# Patient Record
Sex: Female | Born: 1935 | Race: White | Hispanic: No | Smoking: Never smoker
Health system: Southern US, Community
[De-identification: ages and names within clinical notes are randomized; demographics above are authoritative.]

## PROBLEM LIST (undated history)

## (undated) DIAGNOSIS — N200 Calculus of kidney: Secondary | ICD-10-CM

## (undated) DIAGNOSIS — K589 Irritable bowel syndrome without diarrhea: Secondary | ICD-10-CM

## (undated) DIAGNOSIS — F419 Anxiety disorder, unspecified: Secondary | ICD-10-CM

## (undated) DIAGNOSIS — L500 Allergic urticaria: Secondary | ICD-10-CM

## (undated) DIAGNOSIS — L719 Rosacea, unspecified: Secondary | ICD-10-CM

## (undated) DIAGNOSIS — C50919 Malignant neoplasm of unspecified site of unspecified female breast: Secondary | ICD-10-CM

## (undated) DIAGNOSIS — K219 Gastro-esophageal reflux disease without esophagitis: Secondary | ICD-10-CM

## (undated) DIAGNOSIS — M1990 Unspecified osteoarthritis, unspecified site: Secondary | ICD-10-CM

## (undated) DIAGNOSIS — I517 Cardiomegaly: Secondary | ICD-10-CM

## (undated) DIAGNOSIS — R5383 Other fatigue: Secondary | ICD-10-CM

## (undated) DIAGNOSIS — N390 Urinary tract infection, site not specified: Secondary | ICD-10-CM

## (undated) DIAGNOSIS — M549 Dorsalgia, unspecified: Secondary | ICD-10-CM

## (undated) HISTORY — DX: Allergic urticaria: L50.0

## (undated) HISTORY — DX: Anxiety disorder, unspecified: F41.9

## (undated) HISTORY — DX: Other fatigue: R53.83

## (undated) HISTORY — DX: Calculus of kidney: N20.0

## (undated) HISTORY — PX: HERNIA REPAIR: SHX51

## (undated) HISTORY — PX: HIP SURGERY: SHX245

## (undated) HISTORY — DX: Unspecified osteoarthritis, unspecified site: M19.90

## (undated) HISTORY — DX: Urinary tract infection, site not specified: N39.0

## (undated) HISTORY — DX: Rosacea, unspecified: L71.9

## (undated) HISTORY — DX: Cardiomegaly: I51.7

## (undated) HISTORY — DX: Malignant neoplasm of unspecified site of unspecified female breast: C50.919

## (undated) HISTORY — DX: Irritable bowel syndrome, unspecified: K58.9

## (undated) HISTORY — DX: Dorsalgia, unspecified: M54.9

## (undated) HISTORY — PX: CHOLECYSTECTOMY: SHX55

## (undated) HISTORY — PX: TONSILLECTOMY: SUR1361

## (undated) HISTORY — DX: Gastro-esophageal reflux disease without esophagitis: K21.9

---

## 1984-07-23 HISTORY — PX: MASTECTOMY: SHX3

## 2008-09-02 ENCOUNTER — Ambulatory Visit (HOSPITAL_COMMUNITY): Admission: RE | Admit: 2008-09-02 | Discharge: 2008-09-02 | Payer: Self-pay | Admitting: Orthopedic Surgery

## 2008-11-10 ENCOUNTER — Inpatient Hospital Stay (HOSPITAL_COMMUNITY): Admission: RE | Admit: 2008-11-10 | Discharge: 2008-11-13 | Payer: Self-pay | Admitting: Orthopedic Surgery

## 2010-11-01 LAB — COMPREHENSIVE METABOLIC PANEL
CO2: 31 mEq/L (ref 19–32)
Calcium: 9.6 mg/dL (ref 8.4–10.5)
Creatinine, Ser: 0.5 mg/dL (ref 0.4–1.2)
GFR calc Af Amer: >60 mL/min (ref >60)
GFR calc non Af Amer: >60 mL/min (ref >60)
Glucose, Bld: 109 mg/dL — ABNORMAL HIGH (ref 70–99)
Total Protein: 6.9 g/dL (ref 6.0–8.3)

## 2010-11-01 LAB — URINALYSIS, ROUTINE W REFLEX MICROSCOPIC
Ketones, ur: NEGATIVE mg/dL
Nitrite: NEGATIVE
Protein, ur: NEGATIVE mg/dL
Urobilinogen, UA: 0.2 mg/dL (ref 0.0–1.0)

## 2010-11-01 LAB — CBC
HCT: 23.5 % — ABNORMAL LOW (ref 36.0–46.0)
HCT: 26.6 % — ABNORMAL LOW (ref 36.0–46.0)
HCT: 31.8 % — ABNORMAL LOW (ref 36.0–46.0)
Hemoglobin: 8.2 g/dL — ABNORMAL LOW (ref 12.0–15.0)
Hemoglobin: 12.6 g/dL (ref 12.0–15.0)
MCHC: 34 g/dL (ref 30.0–36.0)
MCHC: 34.8 g/dL (ref 30.0–36.0)
MCHC: 35.1 g/dL (ref 30.0–36.0)
MCHC: 33.9 g/dL (ref 30.0–36.0)
MCV: 91.5 fL (ref 78.0–100.0)
MCV: 91.8 fL (ref 78.0–100.0)
MCV: 92.9 fL (ref 78.0–100.0)
MCV: 93.8 fL (ref 78.0–100.0)
Platelets: 164 10*3/uL (ref 150–400)
Platelets: 169 10*3/uL (ref 150–400)
RBC: 2.9 MIL/uL — ABNORMAL LOW (ref 3.87–5.11)
RBC: 3.95 MIL/uL (ref 3.87–5.11)
RDW: 14.1 % (ref 11.5–15.5)
RDW: 14 % (ref 11.5–15.5)
WBC: 12.3 10*3/uL — ABNORMAL HIGH (ref 4.0–10.5)
WBC: 9.6 10*3/uL (ref 4.0–10.5)

## 2010-11-01 LAB — BASIC METABOLIC PANEL
BUN: 4 mg/dL — ABNORMAL LOW (ref 6–23)
BUN: 4 mg/dL — ABNORMAL LOW (ref 6–23)
CO2: 25 mEq/L (ref 19–32)
CO2: 27 mEq/L (ref 19–32)
CO2: 28 mEq/L (ref 19–32)
Chloride: 102 mEq/L (ref 96–112)
Chloride: 104 mEq/L (ref 96–112)
Chloride: 104 mEq/L (ref 96–112)
Creatinine, Ser: 0.43 mg/dL (ref 0.4–1.2)
Creatinine, Ser: 0.52 mg/dL (ref 0.4–1.2)
GFR calc Af Amer: >60 mL/min (ref >60)
GFR calc non Af Amer: >60 mL/min (ref >60)
Glucose, Bld: 118 mg/dL — ABNORMAL HIGH (ref 70–99)
Glucose, Bld: 121 mg/dL — ABNORMAL HIGH (ref 70–99)
Potassium: 3.5 mEq/L (ref 3.5–5.1)
Potassium: 3.7 mEq/L (ref 3.5–5.1)
Potassium: 4.2 mEq/L (ref 3.5–5.1)
Sodium: 135 mEq/L (ref 135–145)

## 2010-11-01 LAB — PROTIME-INR
Prothrombin Time: 15.3 seconds — ABNORMAL HIGH (ref 11.6–15.2)
Prothrombin Time: 32.9 seconds — ABNORMAL HIGH (ref 11.6–15.2)
Prothrombin Time: 13.1 seconds (ref 11.6–15.2)

## 2010-11-01 LAB — TYPE AND SCREEN

## 2010-11-01 LAB — APTT: aPTT: 28 seconds (ref 24–37)

## 2010-11-01 LAB — ABO/RH: ABO/RH(D): A POS

## 2010-11-01 LAB — PREPARE RBC (CROSSMATCH)

## 2010-12-05 NOTE — Discharge Summary (Signed)
NAMESHELBIA, SCINTO NO.:  [id number]   MEDICAL RECORD NO.:  [id number]          PATIENT TYPE:  INP   LOCATION:  1618                         FACILITY:  [facility]   PHYSICIAN:  Ollen Gross, M.D.    DATE OF BIRTH:  03-24-1934   DATE OF ADMISSION:  11/10/2008  DATE OF DISCHARGE:  11/13/2008                               DISCHARGE SUMMARY   ADMITTING DIAGNOSIS:  Loose right total hip arthroplasty.   DISCHARGE DIAGNOSES:  1. Loose right total hip arthroplasty.  2. Acute postoperative anemia treated with transfusion.   OPERATION:  On November 10, 2008, the patient underwent right total hip  arthroplasty revision with proximal femoral allografting.   BRIEF HISTORY:  This is a 77 year old lady who had total hip replacement  arthroplasty done several years ago at [facility].  She had an  acetabular revision in '84, another revision in '94.  Significant  femoral ostial was seen.  Here recently, she has had increasing pain and  discomfort in her lateral hip with sharp transient pains.  Bone scan  showed marked increase activity at the tip of the stem consistent with  loosening of the femoral stem.  Bone loss was also seen, and after much  discussion including the risks and benefits of surgery, it was decided  to go ahead with the above procedure.  She was cleared preoperatively by  Dr. Norman Herrlich.   COURSE IN THE HOSPITAL:  The patient tolerated the surgical procedure  quite well.  We placed her on Coumadin protocol postoperative for  prevention of DVT.  Meclizine and Dramamine was started for some vertigo  which she had postoperatively in the hospital.  Her hemoglobin dropped  to 8.2.  She was transfused with 2 units of blood, bringing her  hemoglobin up to 10.8 with hematocrit of 31.8.  Very little physical  therapy was done.  She was essentially partial weightbearing 25-50% on  the operative side.  We learned that a bed was available for her to go  to  rehabilitation, and once this was confirmed, by Dr. Shelle Iron discussed  this with the patient and her family, and she agreed to be transferred  to a private room at the nursing facility.  The dressing was clean and  dry.  Neurovascular was intact to the operative extremity.  Felt she  could be safely transferred, and arrangements were made for transfer.   Laboratory values in the hospital hematologically showed a preoperative  hemoglobin of 12.6 with a hematocrit 37.1.  As mentioned above, this  dropped to 8.2 with hematocrit of the 23.5, and her final hemoglobin was  10.8 with hematocrit of 31.8.  Blood chemistries were within normal  limits at discharge.  Sodium was 136 and potassium 3.7.  Urinalysis  showed some oxalate crystals, otherwise within normal limits.  When she  came in, her sodium was 146 and glucose (nonfasting) 109.  No chest x-  ray nor electrocardiogram seen on this chart.   CONDITION ON DISCHARGE:  Improved, stable.   PLAN:  The patient will be transferred to rehabilitation to continue  with her  physical therapy with 25-50% on the operative side  weightbearing.   CURRENT MEDICATIONS:  1. Metronidazole 500 mg every 6 hours for 10 days, the last one taken      April 19.  2. Zofran 4 mg p.r.n. nausea.  3. Robaxin 500 mg p.o. q.6 h. p.r.n. muscle spasm.  4. Oxycodone/APAP 1 to 2 q.4-6 h. p.r.n. pain.  5. Laxative of choice.  6. Dramamine 50 mg tablets q.6 h. p.r.n. dizziness.  7. Cepacol at bedside for sore throat.   Ms. Mitschke is to continue with Coumadin protocol for 4 weeks after the  date of surgery.  Best to keep the INR between 2 and 3.      [name].      Ollen Gross, M.D.  Electronically Signed    DLU/MEDQ  D:  11/13/2008  T:  11/13/2008  Job:  [id number]   cc:   Feliciana Rossetti, MD  Fax: [phone]   Lenice Pressman, M.D.

## 2010-12-05 NOTE — H&P (Signed)
[name], [name] NO.:  [id number]   MEDICAL RECORD NO.:  [id number]          PATIENT TYPE:  INP   LOCATION:  1618                         FACILITY:  [facility]   PHYSICIAN:  Ollen Gross, M.D.    DATE OF BIRTH:  1934-02-29   DATE OF ADMISSION:  11/10/2008  DATE OF DISCHARGE:                              HISTORY & PHYSICAL   DATE OF OFFICE VISIT AND HISTORY AND PHYSICAL:  October 29, 2008.   CHIEF COMPLAINT:  Loose right total hip arthroplasty.   HISTORY OF PRESENT ILLNESS:  Ms. Elizabeth [name] is a 77 year old female who  has been seen in consultation by Dr. Ollen Gross for ongoing problems  with her right hip.  She has had a total hip revision done several years  ago.  Originally her primary surgery was done in [facility] at [facility].  She had an acetabular revision in 1984 at [place], and another  revision in 1994 by Dr. Barrie Lyme at [place].  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  She had a long porous-coated stem done.  For several years she did very  well with no problems.  Unfortunately, recently she has had progressive  intermittent episodes of severe lateral hip pain.  She states any time  that she walks real fast has sharp pains and it has progressively gotten  worse.  She was seen in consultation by Dr. Lequita Halt.  She has undergone  a bone scan.  It showed marked increased activity at the tip of the stem  consistent with loosening of the femoral stem.  The acetabulum looks  fine, but unfortunately she has had loosening of her AML prosthesis.  She has also had a fair amount of bone loss and felt that she would  require revision arthroplasty due to the loss of a lot of the proximal  bone.  It is explained to the patient that she would best be served by  undergoing revision of the femur which can be done in a couple of  different ways.  It is felt that the best option would be a proximal  femoral allograft  utilizing a long stem prosthesis.  Risks and benefits  of this procedure have been discussed with the patient at length and  felt to be stable for surgery.  She has been seen preoperatively by Dr.  Norman Herrlich at the request of Dr. Shary Decamp and Dr. Lequita Halt, and felt that  the patient is stable and could proceed with procedure.   ALLERGIES:  No known drug allergies, intolerances, sensitivity to pain  medications.   CURRENT MEDICATIONS:  Tylenol.   PAST MEDICAL HISTORY:  1. Esophageal reflux.  2. Renal calculi.  3. Rosacea.  4. Cardiomegaly.  5. Hyperlipidemia.  6. Osteoarthritis.  7. Vertigo.  8. Depression.  9. History of gastric polyps.  10.History of urinary tract infections.  11.History of breast cancer.  12.Postmenopausal.  13.Childhood illnesses of measles and mumps.   PAST SURGICAL HISTORY:  1. Appendectomy.  2. Hernia repair.  3. Gallbladder surgery.  4. Tonsillectomy.  5. Left  mastectomy in 1986.  6. Primary total hip replacement in 1976.  7. Acetabular revision at [facility] in 1984.  8. A second revision hip replacement by Dr. Barrie Lyme in [place] in      1994.   FAMILY HISTORY:  Mother with a history of cancer.   SOCIAL HISTORY:  Widowed, retired, nonsmoker.  No alcohol.  Lives alone.  She does want to look into a rehab facility.  She has a 1-story home.   REVIEW OF SYSTEMS:  GENERAL:  No fevers, chills, night sweats.  NEURO:  No seizures, syncope or paralysis.  RESPIRATORY:  No shortness of  breath, productive cough or hemoptysis.  CARDIOVASCULAR:  No chest pain  or orthopnea.  GI:  A little bit of slight constipation.  No nausea,  vomiting, diarrhea.  GU: No dysuria, hematuria or discharge.  MUSCULOSKELETAL: Right hip.   PHYSICAL EXAMINATION:  VITAL SIGNS:  Pulse 74, respirations 12, blood  pressure 142/58.  GENERAL:  A 77 year old white female well-nourished, well-developed, in  no acute distress, alert and cooperative, very pleasant, excellent  historian.   She is accompanied by her friend.  She is somewhat anxious  at the time of exam.  HEENT:  Normocephalic, atraumatic.  Pupils are round and reactive.  Oropharynx clear.  EOMs intact.  NECK:  Supple.  No carotid bruits are appreciated.  CHEST:  Clear to auscultation anterior and posterior chest walls.  No  rhonchi, rales or wheezing.  HEART:  Regular rhythm.  No murmur.  S1 and S2 are noted.  ABDOMEN:  Soft, nontender.  Bowel sounds present.  RECTAL, BREASTS, GENITALIA:  Not done, not pertinent to present illness.  EXTREMITIES:  Right hip:  Right hip flexion is about 100 degrees,  internal rotation 20, external rotation about 30, about 40 degrees of  abduction with a little discomfort, tender over the lateral aspect of  the right hip.   IMPRESSION:  Loose right total hip arthroplasty.   PLAN:  The patient will be admitted to [facility] to undergo a  revision right total hip arthroplasty with a proximal femoral  allografting.  Surgery will be performed by Dr. Ollen Gross.      Alexzandrew L. Perkins, P.A.C.      Ollen Gross, M.D.  Electronically Signed    ALP/MEDQ  D:  11/11/2008  T:  11/12/2008  Job:  [id number]   cc:   Feliciana Rossetti, MD  Fax: [phone]   Norman Herrlich, M.D.  [facility]  [address]  [place], [place] [address]

## 2010-12-05 NOTE — Discharge Summary (Signed)
Elizabeth [name], Elizabeth [name] NO.:  [id number]   MEDICAL RECORD NO.:  [id number]          PATIENT TYPE:  INP   LOCATION:  1618                         FACILITY:  [facility]   PHYSICIAN:  Ollen Gross, M.D.    DATE OF BIRTH:  1934-07-20   DATE OF ADMISSION:  11/10/2008  DATE OF DISCHARGE:  11/13/2008                               DISCHARGE SUMMARY   ADDENDUM:  [name] is to continue with codeine protocol for 4 weeks  after the date of surgery.  Best to keep the INR between 2 and 3.      [name].      Ollen Gross, M.D.     DLU/MEDQ  D:  11/13/2008  T:  11/13/2008  Job:  [id number]   cc:   Ollen Gross, M.D.  Fax: [phone]   Feliciana Rossetti, MD  Fax: [phone]

## 2010-12-05 NOTE — Op Note (Signed)
NAMESHAHRZAD, KOBLE NO.:  [id number]   MEDICAL RECORD NO.:  [id number]          PATIENT TYPE:  INP   LOCATION:  1618                         FACILITY:  [facility]   PHYSICIAN:  Ollen Gross, M.D.    DATE OF BIRTH:  Dec 10, 1933   DATE OF PROCEDURE:  11/10/2008  DATE OF DISCHARGE:                               OPERATIVE REPORT   PREOPERATIVE DIAGNOSIS:  Failed right total hip arthroplasty, aseptic  loosening.   POSTOPERATIVE DIAGNOSIS:  Failed right total hip arthroplasty, aseptic  loosening.   PROCEDURE:  Right total hip arthroplasty revision with proximal femoral  allografting.   SURGEON:  Dr. Lequita Halt.   ASSISTANT:  Avel Peace, PA-C.   ANESTHESIA:  General.   ESTIMATED BLOOD LOSS:  350.   REPLACEMENT:  150 of Cell Saver.   DRAIN:  Hemovac x1.   COMPLICATIONS:  None.   CONDITION:  Stable to recovery.   BRIEF CLINICAL NOTE:  Ms. Ullery is a 77 year old female who has had  multiple problems in regards to her right hip.  She has had revision  surgery as before, most recently done in [place] over 10 years ago.  She has significant thigh pain and bone scan was consistent with  probable loosening of the femoral component.  She has had progressively  worsening pain and dysfunction and presents now for revision hip  arthroplasty.   PROCEDURE IN DETAIL:  After the successful administration of anesthesia,  the patient was placed in the left lateral decubitus position with the  right side up and held with the hip positioner.  The right lower  extremity was isolated from her perineum with plastic drapes and prepped  and draped in the usual sterile fashion.  A long posterolateral incision  was made with a 10 blade through subcutaneous tissue to the level of the  fascia lata which was incised in line with the skin incision.  The  sciatic nerve was palpated and protected and posterior pseudocapsule  excised off of the femur.  We encountered a lot of  osteolytic debris in  the joint.  I removed any small pieces of heterotopic bone present.  I  was able to dislocate the hip and the femoral head was removed.  We then  translocated the femur anteriorly to gain acetabular exposure.  There  was asymmetric polyethylene wear and the acetabular liner was removed.  The acetabular component was well fixed and in good position.  I had a  tonsil and poked through the screw holes that were not filled with  screws and we did not get into any acetabular cysts.  The size of the  cup is a 60 and is a Biomet cup.  We placed a trial 40 mm neutral +5  liner.   The femur was then addressed.  There was a tremendous amount of proximal  bone loss.  The proximal 2-1/2 inches of the stem were all the exposed  with no bone.  The greater trochanter was still intact.  I did a  trochanteric osteotomy to preserve the greater trochanter and used it to  reattach to the allograft.  The stem was fixed and at least with fibrous  tissue.  The bone quality was extremely poor of the remaining bone  proximally.  I was able to easily separate the bone from the prosthesis  and then removed the prosthesis.  Due to the minimal amount of bone left  along the length of the entire prosthesis, I removed the remaining bone  and we did a step-cut osteotomy of the femur.  This was started at the  level of the isthmus.  This was to allow for better fixation of our  allograft.   The right sided proximal femoral allograft was thawed out and I prepared  this on the back table by reaming up to 16.5 mm for placement of a 15 mm  in diameter stem.  I then reamed the allograft proximally up to a 20D  for placement of a 20B sleeve to give [place] a cement mantle for cementing  the sleeve into the allograft.  We prepared for a large to allow for a  small sleeve to go in with the mantle.  We then placed the allograft  into the wound and placed traction on the leg to get an equal length  with the  left leg.  I then marked out the length on the allograft and  did the reversed step-cut on the allograft.  We then with the flexible  reamers reamed distal into her native bone up to 16.5 mm.  I then took  the SROM trial which was a 20 x 15 with a 36 +8 neck extra long length  and passed that through the allograft and then into her native bone.  We  did this with a 40 +0 head and reduced the hip.  There was excellent  soft tissue tension with this construct.  I was able to place a fracture  reduction clamp at the level of the allograft host junction and we  rotated the hip through full motion and with full extension, full  external rotation at 70 degrees of flexion, 40 degrees of abduction, 90  degrees internal rotation, 90 degrees of flexion, 70 degrees of internal  rotation and were stable throughout those parameters.   We then removed the trial and the allograft.  The allograft was soaked  in antibiotic laden solution.  I then removed the trial liner from the  acetabular component and placed the Biomet 40 mm +5 polyethylene liner  into the acetabular shell with a good stable fit.  The permanent 20B  small sleeve was then cemented into the allograft one batch of cement.  All extruded cement was removed.  We then began to impact the 20 x 15  extra long stem with the 36 +8 neck and impacted it through the  allograft.  And then once it was through the allograft, attached it to  the host bone and impacted it all the way down into the host canal.  We  had good stability, but there was some rotational movement between the  host and allograft.  I wanted her to reattach the greater trochanter to  the graft and I was going to use a small plate for that, but decided to  go with the long Zimmer plate in order to also have this bypass the  allograft host junction and obtain fixation there.   We reattached the greater trochanter to the allograft with the long  Zimmer cable plate system.  I impacted  the plate onto the greater  trochanter and then  with two cables proximally reattached the trochanter  to the allograft with the plate.  We also had a third cable proximal to  the allograft host junction and two cables distal to the allograft host  junction.  These were passed through the plate and then around the femur  and back to the plate.  We sequentially tightened all of these until the  plate was down on the bone.  When they were effectively tightened, then  we crimped the locking mechanism and cut the cables.  This had a very  stable reduction.  We again placed through her through a range of motion  and found this to be very stable with stable reduction of the greater  trochanter to the allograft and a stable junction between the graft and  host bone distally.  The wound was then copiously irrigated with saline  solution and the vastus lateralis fascia closed with running #1 Vicryl.  A Hemovac drain was placed and the fascia lata was closed with a running  #1 Vicryl.  Subcu closed with interrupted #1-0 and #2-0 Vicryl and skin  with staples.  The drain was hooked to suction and a bulky sterile  dressing was applied.  She was placed into a knee immobilizer, awakened  and transported to recovery in stable condition.     Ollen Gross, M.D.  Electronically Signed    FA/MEDQ  D:  11/15/2008  T:  11/15/2008  Job:  [id number]

## 2011-12-06 ENCOUNTER — Encounter: Payer: Self-pay | Admitting: Pulmonary Disease

## 2011-12-07 ENCOUNTER — Ambulatory Visit (INDEPENDENT_AMBULATORY_CARE_PROVIDER_SITE_OTHER): Payer: Medicare Other | Admitting: Pulmonary Disease

## 2011-12-07 ENCOUNTER — Encounter: Payer: Self-pay | Admitting: Pulmonary Disease

## 2011-12-07 VITALS — BP 118/76 | HR 79 | Temp 98.4°F | Ht 62.0 in | Wt 110.6 lb

## 2011-12-07 DIAGNOSIS — R059 Cough, unspecified: Secondary | ICD-10-CM

## 2011-12-07 DIAGNOSIS — R05 Cough: Secondary | ICD-10-CM | POA: Insufficient documentation

## 2011-12-07 MED ORDER — BENZONATATE 100 MG PO CAPS: ORAL_CAPSULE | ORAL | Status: AC

## 2011-12-07 NOTE — Progress Notes (Signed)
  Subjective:    Patient ID: Elizabeth [name], female    DOB: 04-03-1934, 78 y.o.   MRN: [id number]  HPI The patient is a 78 year old female who I've been asked to see for chronic cough.  She was in her usual state of health until the third week of April when she began to develop cough with discolored mucus.  She was felt to have acute bronchitis, and was treated with an antibiotic.  Her cough improved almost to baseline, and then began to escalate again.  However, she felt her cough was different than what it had been when things first started.  It was primarily dry and hacking in nature, but she is unable to tell if she has a tickle in her throat or a globus sensation.  She has also been blowing large quantities of purulent mucus from her nose, but this has begun to clear after the antibiotics.  She admits to having throat clearing at times, but does not feel that it is a significant problem.  However, her cough is worse with prolonged conversation and also strong odors.  Patient denies any reflux symptoms at this time, and has no history of asthma.  She has had a recent CT chest that was unremarkable.   Review of Systems  Constitutional: Positive for unexpected weight change. Negative for fever.  HENT: Negative.  Negative for ear pain, nosebleeds, congestion, sore throat, rhinorrhea, sneezing, trouble swallowing, dental problem, postnasal drip and sinus pressure.   Eyes: Negative.  Negative for redness and itching.  Respiratory: Positive for cough. Negative for chest tightness, shortness of breath and wheezing.   Cardiovascular: Negative.  Negative for palpitations and leg swelling.  Gastrointestinal: Negative.  Negative for nausea and vomiting.  Genitourinary: Negative.  Negative for dysuria.  Musculoskeletal: Negative.  Negative for joint swelling.  Skin: Negative.  Negative for rash.  Neurological: Negative.  Negative for headaches.  Hematological: Negative.  Does not bruise/bleed easily.    Psychiatric/Behavioral: Negative.  Negative for dysphoric mood. The patient is not nervous/anxious.        Objective:   Physical Exam Constitutional:  Well developed, no acute distress  HENT:  Nares patent without discharge, but inflammed nasal mucosa noted.  No purulence.   Oropharynx without exudate, palate and uvula are normal  Eyes:  Perrla, eomi, no scleral icterus  Neck:  No JVD, no TMG  Cardiovascular:  Normal rate, regular rhythm, no rubs or gallops.  No murmurs        Intact distal pulses  Pulmonary :  Normal breath sounds, no stridor or respiratory distress   No rales, rhonchi, or wheezing  Abdominal:  Soft, nondistended, bowel sounds present.  No tenderness noted.   Musculoskeletal:  No lower extremity edema noted.  Lymph Nodes:  No cervical lymphadenopathy noted  Skin:  No cyanosis noted  Neurologic:  Alert, appropriate, moves all 4 extremities without obvious deficit.         Assessment & Plan:

## 2011-12-07 NOTE — Patient Instructions (Signed)
Take chlorpheniramine 8mg  at bedtime and at lunch everyday for next 3 weeks. Continue with your sinus rinses No throat clearing, use hard candy (no mint or menthol) during the day to bathe the back of your throat. Minimize voice use as much as possible. Take prilosec otc 20mg  each day before dinner. Use tessalon pearls during day 2 every 6 hrs if needed for cough followup with me in 3 weeks, but call if cough worsens.

## 2011-12-07 NOTE — Assessment & Plan Note (Signed)
The patient's cough is probably upper airway in origin than lower.  I suspect it is being propagated by postnasal drip, possibly a component of reflux, and also cyclical coughing.  I cannot rule out the possibility of a chronic sinusitis, and will need to image the sinuses if her symptoms continue.  For now, I would like to treat her for the above entities, and I have also reviewed the behavioral therapies for cyclical coughing.  I will see her back in 3 weeks to check on progress.

## 2012-01-02 ENCOUNTER — Ambulatory Visit: Payer: Medicare Other | Admitting: Pulmonary Disease

## 2015-09-15 DIAGNOSIS — D692 Other nonthrombocytopenic purpura: Secondary | ICD-10-CM

## 2015-09-15 DIAGNOSIS — R238 Other skin changes: Secondary | ICD-10-CM | POA: Diagnosis not present

## 2018-05-16 ENCOUNTER — Encounter: Payer: Self-pay | Admitting: Gastroenterology

## 2018-05-21 ENCOUNTER — Encounter: Payer: Self-pay | Admitting: Gastroenterology

## 2018-05-21 ENCOUNTER — Ambulatory Visit: Payer: Medicare Other | Admitting: Gastroenterology

## 2018-05-21 VITALS — BP 138/62 | HR 72 | Ht 61.0 in | Wt 111.2 lb

## 2018-05-21 DIAGNOSIS — R195 Other fecal abnormalities: Secondary | ICD-10-CM

## 2018-05-21 NOTE — Patient Instructions (Addendum)
If you are age 82 or older, your body mass index should be between 23-30. Your Body mass index is 21.02 kg/m. If this is out of the aforementioned range listed, please consider follow up with your Primary Care Provider.  If you are age 61 or younger, your body mass index should be between 19-25. Your Body mass index is 21.02 kg/m. If this is out of the aformentioned range listed, please consider follow up with your Primary Care Provider.   You have been scheduled for a colonoscopy. Please follow written instructions given to you at your visit today.  Please pick up your prep supplies at the pharmacy within the next 1-3 days. If you use inhalers (even only as needed), please bring them with you on the day of your procedure. Your physician has requested that you go to www.startemmi.com and enter the access code given to you at your visit today. This web site gives a general overview about your procedure. However, you should still follow specific instructions given to you by our office regarding your preparation for the procedure.  Please purchase the following medications over the counter and take as directed: Lactaid 2 per meal  Thank you,  Dr. Jackquline Denmark

## 2018-05-21 NOTE — Progress Notes (Signed)
Chief Complaint: heme positive stools.  Referring Provider:  Raina Mina., MD      ASSESSMENT AND PLAN;   #1. Heme positive stools. Nl CBC, neg CT abdo/pelvis at [facility] (report awaited)  #2. Lactose intolerance.  Plan -Lactaid 2/day with each lactose containing diet. -Proceed with colonoscopy.  I have discussed the risks and benefits.  The risks including risk of perforation requiring laparotomy, bleeding after polypectomy requiring blood transfusions and risks of anesthesia/sedation were discussed.  Rare risks of missing colorectal neoplasms were also discussed.  Alternatives were given.  Patient is fully aware and agrees to proceed. All the questions were answered. Colonoscopy will be scheduled in upcoming days.  Patient is to report immediately if there is any significant weight loss or excessive bleeding until then. Consent forms were given for review.   HPI:    Elizabeth [name] is a 85 y.o. female  With heme positive stools on routine physical examination No Rectal bleeding No nausea, vomiting, heartburn, regurgitation, odynophagia or dysphagia.  No significant diarrhea or constipation.  There is no melena or hematochezia. No unintentional weight loss.  Had Nl CBC with Hb 13.3 02/2018, neg CT.  Patient has been considerable stress since she moved twice in 2 months.  No Abdominal pain.  Never had colonoscopy.   Past Medical History:  Diagnosis Date  . Abdominal pain   . Allergic rhinitis   . Allergic urticaria   . Anxiety   . Arthritis   . Backache   . Breast cancer ([place])   . Cardiomegaly   . Esophageal reflux   . Fatigue   . IBS (irritable bowel syndrome)   . Nephrolithiasis   . Osteoarthritis   . Rosacea   . UTI (urinary tract infection)     Past Surgical History:  Procedure Laterality Date  . CHOLECYSTECTOMY    . HERNIA REPAIR    . HIP SURGERY     x4  . MASTECTOMY Left 1986  . TONSILLECTOMY      Family History  Problem Relation Age of Onset  .  Lung cancer Mother   . Liver cancer Mother   . Kidney cancer Mother   . Colon cancer Neg Hx   . Esophageal cancer Neg Hx     Social History   Tobacco Use  . Smoking status: Never Smoker  . Smokeless tobacco: Never Used  Substance Use Topics  . Alcohol use: Never    Frequency: Never  . Drug use: Never    Current Outpatient Medications  Medication Sig Dispense Refill  . acetaminophen (TYLENOL) 500 MG tablet Take 500 mg by mouth every 6 (six) hours as needed.    . carboxymethylcellulose (REFRESH PLUS) 0.5 % SOLN 1 drop as needed.     No current facility-administered medications for this visit.     Allergies  Allergen Reactions  . Clotrimazole-Betamethasone   . Levofloxacin   . Penicillins     Review of Systems:  Constitutional: Denies fever, chills, diaphoresis, appetite change and fatigue.  HEENT: Denies photophobia, eye pain, redness, hearing loss, ear pain, congestion, sore throat, rhinorrhea, sneezing, mouth sores, neck pain, neck stiffness and tinnitus.   Respiratory: Denies SOB, DOE, cough, chest tightness,  and wheezing.   Cardiovascular: Denies chest pain, palpitations and leg swelling.  Genitourinary: Denies dysuria, urgency, frequency, hematuria, flank pain and difficulty urinating.  Musculoskeletal: Denies myalgias, back pain, joint swelling, arthralgias and gait problem.  Skin: No rash.  Neurological: Denies dizziness, seizures, syncope, weakness, light-headedness,  numbness and headaches.  Hematological: Denies adenopathy. Easy bruising, personal or family bleeding history  Psychiatric/Behavioral: Has anxiety or depression.     Physical Exam:    BP 138/62   Pulse 72   Ht 5\' 1"  (1.549 m)   Wt 111 lb 4 oz (50.5 kg)   BMI 21.02 kg/m  Filed Weights   05/21/18 1059  Weight: 111 lb 4 oz (50.5 kg)   Constitutional:  Well-developed, in no acute distress. Psychiatric: Normal mood and affect. Behavior is normal. HEENT: Pupils normal.  Conjunctivae are  normal. No scleral icterus. Neck supple.  Cardiovascular: Normal rate, regular rhythm. No edema Pulmonary/chest: Effort normal and breath sounds normal. No wheezing, rales or rhonchi. Abdominal: Soft, nondistended. Nontender. Bowel sounds active throughout. There are no masses palpable. No hepatomegaly. Rectal:  defered Neurological: Alert and oriented to person place and time. Skin: Skin is warm and dry. No rashes noted.  Data Reviewed:  Hemoglobin 13.3 03/20/2018 Hemoglobin 13.6 08/30/2017 Nl CMP    Carmell Austria, MD 05/21/2018, 11:16 AM  Cc: Raina Mina., MD

## 2018-05-22 ENCOUNTER — Telehealth: Payer: Self-pay | Admitting: Gastroenterology

## 2018-05-22 NOTE — Telephone Encounter (Signed)
Lets repeat Hemoccult cards x 3 in 2 weeks.

## 2018-05-22 NOTE — Telephone Encounter (Signed)
Patient cx procedure for this Tues 11/5 states that she was given the option to get proc or do another stool sample she wants to do another stool sample instead of colonoscopy.

## 2018-05-22 NOTE — Telephone Encounter (Signed)
Larena Glassman would you take care of this for Dr Lyndel Safe?  Thank you

## 2018-05-22 NOTE — Telephone Encounter (Signed)
Please advise. I did not see this in the assessment and plan.

## 2018-05-23 NOTE — Telephone Encounter (Signed)
Patient will come by office and pick up Hemmocult cards

## 2018-05-27 ENCOUNTER — Encounter: Payer: Medicare Other | Admitting: Gastroenterology

## 2019-01-02 ENCOUNTER — Encounter: Payer: Self-pay | Admitting: Gastroenterology

## 2019-01-02 ENCOUNTER — Telehealth (INDEPENDENT_AMBULATORY_CARE_PROVIDER_SITE_OTHER): Payer: Medicare Other | Admitting: Gastroenterology

## 2019-01-02 ENCOUNTER — Other Ambulatory Visit: Payer: Self-pay

## 2019-01-02 VITALS — Ht 62.0 in | Wt 104.0 lb

## 2019-01-02 DIAGNOSIS — R195 Other fecal abnormalities: Secondary | ICD-10-CM | POA: Diagnosis not present

## 2019-01-02 DIAGNOSIS — R1013 Epigastric pain: Secondary | ICD-10-CM

## 2019-01-02 DIAGNOSIS — E611 Iron deficiency: Secondary | ICD-10-CM

## 2019-01-02 MED ORDER — SUPREP BOWEL PREP KIT 17.5-3.13-1.6 GM/177ML PO SOLN: 1.0000 | ORAL | 0 refills | Status: DC

## 2019-01-02 MED ORDER — OMEPRAZOLE 20 MG PO CPDR: 20.0000 mg | DELAYED_RELEASE_CAPSULE | Freq: Every day | ORAL | 0 refills | Status: DC

## 2019-01-02 NOTE — Progress Notes (Signed)
Chief Complaint: heme positive stools.  Referring Provider:  Raina Mina., MD      ASSESSMENT AND PLAN;   #1. Heme positive stools. Nl CBC, neg CT abdo/pelvis at [facility] (report awaited)  #2. Epi pain/discomfort with ? melena.  I believe dark stools was because of Kaopectate.  #3.  Iron deficiency without anemia.  Plan -Omeprazole 20mg  po qd x 2 weeks. -Continue probiotics 1 po qd -Proceed with EGD/colonoscopy with MiraLAX in 2 to 3 weeks.  I have discussed the risks and benefits.  The risks including risk of perforation requiring laparotomy, bleeding after polypectomy requiring blood transfusions and risks of anesthesia/sedation were discussed.  Rare risks of missing colorectal neoplasms were also discussed.  Alternatives were given.  Patient is fully aware and agrees to proceed. All the questions were answered. -Records from [facility] regarding recent CT. Larena Glassman to obtain.   HPI:    Elizabeth [name] is a 85 y.o. female  With heme positive stools on routine physical examination Last week had diarrhea x 4 days. Took kaopectetate Was eating strawberries, bread Got better but then had dark stools, then had clear BMs No Rectal bleeding No nausea, vomiting, heartburn, regurgitation, odynophagia or dysphagia.  No significant diarrhea or constipation.  There is no melena or hematochezia. No unintentional weight loss.  Had Nl CBC with Hb 13.3 02/2018, neg CT. most recent labs from 12/29/2018 showed hemoglobin to be 12.8, MCV 92.  She has mild iron deficiency with saturation of 17%, iron 40.  Normal BUN/creatinine.  Patient has been considerable stress since she moved twice in 2 months.  No Abdominal pain. No bloating.  Has lost some weight  Never had colonoscopy.  She was scheduled previously for colonoscopy but did not come.  Has fatigue   Past Medical History:  Diagnosis Date  . Abdominal pain   . Allergic rhinitis   . Allergic urticaria   . Anxiety   . Arthritis    . Backache   . Breast cancer ([place])   . Cardiomegaly   . Esophageal reflux   . Fatigue   . IBS (irritable bowel syndrome)   . Nephrolithiasis   . Osteoarthritis   . Rosacea   . UTI (urinary tract infection)     Past Surgical History:  Procedure Laterality Date  . CHOLECYSTECTOMY    . HERNIA REPAIR    . HIP SURGERY     x4  . MASTECTOMY Left 1986  . TONSILLECTOMY      Family History  Problem Relation Age of Onset  . Lung cancer Mother   . Liver cancer Mother   . Kidney cancer Mother   . Colon cancer Neg Hx   . Esophageal cancer Neg Hx     Social History   Tobacco Use  . Smoking status: Never Smoker  . Smokeless tobacco: Never Used  Substance Use Topics  . Alcohol use: Never    Frequency: Never  . Drug use: Never    Current Outpatient Medications  Medication Sig Dispense Refill  . acetaminophen (TYLENOL) 500 MG tablet Take 500 mg by mouth every 6 (six) hours as needed.    . Probiotic Product (PROBIOTIC DAILY PO) Take by mouth.     No current facility-administered medications for this visit.     Allergies  Allergen Reactions  . Clotrimazole-Betamethasone   . Levofloxacin   . Penicillins     Review of Systems:  neg     Physical Exam:    Ht  5\' 2"  (1.575 m)   Wt 104 lb (47.2 kg)   BMI 19.02 kg/m  Filed Weights   01/02/19 0941  Weight: 104 lb (47.2 kg)   Constitutional:  Well-developed, in no acute distress. Psychiatric: Normal mood and affect. Behavior is normal. HEENT: Pupils normal.  Conjunctivae are normal. No scleral icterus. Neck supple.  Cardiovascular: Normal rate, regular rhythm. No edema Pulmonary/chest: Effort normal and breath sounds normal. No wheezing, rales or rhonchi. Abdominal: Soft, nondistended. Nontender. Bowel sounds active throughout. There are no masses palpable. No hepatomegaly. Rectal:  defered Neurological: Alert and oriented to person place and time. Skin: Skin is warm and dry. No rashes noted.  Data Reviewed:   Hemoglobin 13.3 03/20/2018 Hemoglobin 13.6 08/30/2017 Hb  Nl CMP   Telephonic Visit in Setting of [place]  Patient has given consent for a telephonic visit today and understands that is the same as is required for any face-to-face patient encounter except that the service was provided via telephone.   At the time of this telephonic visit the patient was located at home and I, the Provider, at the office of [facility], [place].   Patient was referred to [facility] - established pt  No one other than patient and I participated in this telephonic visit today.   Total time of telephonic visit/coordination of care: 25 minutes.    Carmell Austria MD           Carmell Austria, MD 01/02/2019, 10:27 AM  Cc: Raina Mina., MD

## 2019-01-02 NOTE — Patient Instructions (Addendum)
If you are age 83 or older, your body mass index should be between 23-30. Your Body mass index is 19.02 kg/m. If this is out of the aforementioned range listed, please consider follow up with your Primary Care Provider.  If you are age 85 or younger, your body mass index should be between 19-25. Your Body mass index is 19.02 kg/m. If this is out of the aformentioned range listed, please consider follow up with your Primary Care Provider.   We have sent the following medications to your pharmacy for you to pick up at your convenience Suprep Omeprazole 20 mg   Please purchase the following medications over the counter and take as directed: Probiotics  You have been scheduled for an endoscopy and colonoscopy. Please follow the written instructions given to you at your visit today. Please pick up your prep supplies at the pharmacy within the next 1-3 days. If you use inhalers (even only as needed), please bring them with you on the day of your procedure. Your physician has requested that you go to www.startemmi.com and enter the access code given to you at your visit today. This web site gives a general overview about your procedure. However, you should still follow specific instructions given to you by our office regarding your preparation for the procedure.  To help prevent the possible spread of infection to our patients, communities, and staff; we will be implementing the following measures:  As of now we are not allowing any visitors/family members to accompany you to any upcoming appointments with [facility]. If you have any concerns about this please contact our office to discuss prior to the appointment.   Thank you,  Dr. Jackquline Denmark

## 2019-01-28 ENCOUNTER — Encounter: Payer: Self-pay | Admitting: Gastroenterology

## 2019-02-06 ENCOUNTER — Telehealth: Payer: Self-pay | Admitting: Gastroenterology

## 2019-02-06 NOTE — Telephone Encounter (Signed)

## 2019-02-06 NOTE — Telephone Encounter (Signed)
Pt responded "no" to all screening questions °

## 2019-02-09 ENCOUNTER — Ambulatory Visit (AMBULATORY_SURGERY_CENTER): Payer: Medicare Other | Admitting: Gastroenterology

## 2019-02-09 ENCOUNTER — Encounter: Payer: Self-pay | Admitting: Gastroenterology

## 2019-02-09 ENCOUNTER — Other Ambulatory Visit: Payer: Self-pay

## 2019-02-09 VITALS — BP 116/53 | HR 59 | Temp 98.3°F | Resp 11 | Ht 62.0 in | Wt 104.0 lb

## 2019-02-09 DIAGNOSIS — K2970 Gastritis, unspecified, without bleeding: Secondary | ICD-10-CM | POA: Diagnosis not present

## 2019-02-09 DIAGNOSIS — R195 Other fecal abnormalities: Secondary | ICD-10-CM

## 2019-02-09 DIAGNOSIS — D127 Benign neoplasm of rectosigmoid junction: Secondary | ICD-10-CM | POA: Diagnosis not present

## 2019-02-09 DIAGNOSIS — K317 Polyp of stomach and duodenum: Secondary | ICD-10-CM

## 2019-02-09 DIAGNOSIS — K5730 Diverticulosis of large intestine without perforation or abscess without bleeding: Secondary | ICD-10-CM | POA: Diagnosis not present

## 2019-02-09 DIAGNOSIS — R1013 Epigastric pain: Secondary | ICD-10-CM

## 2019-02-09 DIAGNOSIS — D122 Benign neoplasm of ascending colon: Secondary | ICD-10-CM | POA: Diagnosis not present

## 2019-02-09 MED ORDER — SODIUM CHLORIDE 0.9 % IV SOLN: 500.0000 mL | Freq: Once | INTRAVENOUS | Status: AC

## 2019-02-09 NOTE — Progress Notes (Signed)
Gauze dressingPaper tape was applied to IV site after IV was removed.  I was very carefull with pt's skin at IV site and taking off electrods.  Pt is a fall risk, so I assisted pt with dressing.  No complaints noted in the recovery room. maw

## 2019-02-09 NOTE — Progress Notes (Signed)
Temperature taken by Fayrene Fearing, CMA, VS taken by Rica Mote, CMA

## 2019-02-09 NOTE — Progress Notes (Signed)
Called to room to assist during endoscopic procedure.  Patient ID and intended procedure confirmed with present staff. Received instructions for my participation in the procedure from the performing physician.  

## 2019-02-09 NOTE — Progress Notes (Signed)
PT taken to PACU. Monitors in place. VSS. Report given to RN. 

## 2019-02-09 NOTE — Op Note (Signed)
[facility] Patient Name: Elizabeth [name] Procedure Date: 02/09/2019 10:16 AM MRN: [id number] Endoscopist: Jackquline Denmark , MD Age: 85 Referring MD:  Date of Birth: [birth year] Gender: Female Account #: [id number] Procedure:                Upper GI endoscopy Indications:              Epigastric abdominal pain, iron deficiency anemia Medicines:                Monitored Anesthesia Care Procedure:                Pre-Anesthesia Assessment:                           - Prior to the procedure, a History and Physical                            was performed, and patient medications and                            allergies were reviewed. The patient's tolerance of                            previous anesthesia was also reviewed. The risks                            and benefits of the procedure and the sedation                            options and risks were discussed with the patient.                            All questions were answered, and informed consent                            was obtained. Prior Anticoagulants: The patient has                            taken no previous anticoagulant or antiplatelet                            agents. ASA Grade Assessment: II - A patient with                            mild systemic disease. After reviewing the risks                            and benefits, the patient was deemed in                            satisfactory condition to undergo the procedure.                           After obtaining informed consent, the endoscope was  passed under direct vision. Throughout the                            procedure, the patient's blood pressure, pulse, and                            oxygen saturations were monitored continuously. The                            Model GIF-HQ190 [id number]) scope was introduced                            through the mouth, and advanced to the second part                            of  duodenum. The upper GI endoscopy was                            accomplished without difficulty. The patient                            tolerated the procedure well. Scope In: Scope Out: Findings:                 The examined esophagus was normal.                           The Z-line was regular and was found 35 cm from the                            incisors.                           Localized mild inflammation characterized by                            erythema was found in the gastric antrum. Biopsies                            were taken with a cold forceps for histology.                            Estimated blood loss: none.                           4-5, 2 to 4 mm sessile polyps with no bleeding and                            no stigmata of recent bleeding were found in the                            gastric body. 2 of the polyps were removed using a  cold biopsy forceps                           The examined duodenum was normal. Biopsies for                            histology were taken with a cold forceps for                            evaluation of celiac disease. Estimated blood loss:                            none.                           A few 2 to 4 mm sessile polyps with no bleeding and                            no stigmata of recent bleeding were found in the                            gastric body. The polyp was removed with a cold                            biopsy forceps. Resection and retrieval were                            complete. Estimated blood loss: none. Complications:            No immediate complications. Estimated Blood Loss:     Estimated blood loss: none. Impression:               -Mild gastritis.                           -Incidental small gastric polyps (s/p polypectomy x                            2). Recommendation:           - Patient has a contact number available for                            emergencies. The  signs and symptoms of potential                            delayed complications were discussed with the                            patient. Return to normal activities tomorrow.                            Written discharge instructions were provided to the                            patient.                           -  Resume previous diet.                           - Continue present medications.                           - Await pathology results.                           - Return to GI clinic in 12 weeks. Jackquline Denmark, MD 02/09/2019 11:03:07 AM This report has been signed electronically.

## 2019-02-09 NOTE — Patient Instructions (Signed)
YOU HAD AN ENDOSCOPIC PROCEDURE TODAY AT THE [facility]:   Refer to the procedure report that was given to you for any specific questions about what was found during the examination.  If the procedure report does not answer your questions, please call your gastroenterologist to clarify.  If you requested that your care partner not be given the details of your procedure findings, then the procedure report has been included in a sealed envelope for you to review at your convenience later.  YOU SHOULD EXPECT: Some feelings of bloating in the abdomen. Passage of more gas than usual.  Walking can help get rid of the air that was put into your GI tract during the procedure and reduce the bloating. If you had a lower endoscopy (such as a colonoscopy or flexible sigmoidoscopy) you may notice spotting of blood in your stool or on the toilet paper. If you underwent a bowel prep for your procedure, you may not have a normal bowel movement for a few days.  Please Note:  You might notice some irritation and congestion in your nose or some drainage.  This is from the oxygen used during your procedure.  There is no need for concern and it should clear up in a day or so.  SYMPTOMS TO REPORT IMMEDIATELY:   Following lower endoscopy (colonoscopy or flexible sigmoidoscopy):  Excessive amounts of blood in the stool  Significant tenderness or worsening of abdominal pains  Swelling of the abdomen that is new, acute  Fever of 100F or higher   Following upper endoscopy (EGD)  Vomiting of blood or coffee ground material  New chest pain or pain under the shoulder blades  Painful or persistently difficult swallowing  New shortness of breath  Fever of 100F or higher  Black, tarry-looking stools  For urgent or emergent issues, a gastroenterologist can be reached at any hour by calling [phone].   DIET:  We do recommend a small meal at first, but then you may proceed to your regular diet.  Drink  plenty of fluids but you should avoid alcoholic beverages for 24 hours.  ACTIVITY:  You should plan to take it easy for the rest of today and you should NOT DRIVE or use heavy machinery until tomorrow (because of the sedation medicines used during the test).    FOLLOW UP: Our staff will call the number listed on your records 48-72 hours following your procedure to check on you and address any questions or concerns that you may have regarding the information given to you following your procedure. If we do not reach you, we will leave a message.  We will attempt to reach you two times.  During this call, we will ask if you have developed any symptoms of COVID 19. If you develop any symptoms (ie: fever, flu-like symptoms, shortness of breath, cough etc.) before then, please call [phone].  If you test positive for Covid 19 in the 2 weeks post procedure, please call and report this information to us.    If any biopsies were taken you will be contacted by phone or by letter within the next 1-3 weeks.  Please call us at [phone] if you have not heard about the biopsies in 3 weeks.    SIGNATURES/CONFIDENTIALITY: You and/or your care partner have signed paperwork which will be entered into your electronic medical record.  These signatures attest to the fact that that the information above on your After Visit Summary has been reviewed and is   understood.  Full responsibility of the confidentiality of this discharge information lies with you and/or your care-partner.    Handouts were given to you on polyps, diverticulosis, and gastritis. NO ASPIRIN, ASPIRIN CONTAINING PRODUCTS (BC OR GOODY POWDERS) OR NSAIDS (IBUPROFEN, ADVIL, ALEVE, AND MOTRIN) FOR 5 days; TYLENOL IS OK TO TAKE You may resume your other current medications today. Await biopsy results. Return to GI clinic in 12 weeks.  The office will call you with this appointment. Please call if any questions or concerns.

## 2019-02-09 NOTE — Op Note (Signed)
[facility] Patient Name: Elizabeth [name] Procedure Date: 02/09/2019 10:15 AM MRN: [id number] Endoscopist: Jackquline Denmark , MD Age: 85 Referring MD:  Date of Birth: 09/19/1933 Gender: Female Account #: [id number] Procedure:                Colonoscopy Indications:              Iron deficiency with heme positive stools. Medicines:                Monitored Anesthesia Care Procedure:                Pre-Anesthesia Assessment:                           - Prior to the procedure, a History and Physical                            was performed, and patient medications and                            allergies were reviewed. The patient's tolerance of                            previous anesthesia was also reviewed. The risks                            and benefits of the procedure and the sedation                            options and risks were discussed with the patient.                            All questions were answered, and informed consent                            was obtained. Prior Anticoagulants: The patient has                            taken no previous anticoagulant or antiplatelet                            agents. ASA Grade Assessment: II - A patient with                            mild systemic disease. After reviewing the risks                            and benefits, the patient was deemed in                            satisfactory condition to undergo the procedure.                           After obtaining informed consent, the colonoscope  was passed under direct vision. Throughout the                            procedure, the patient's blood pressure, pulse, and                            oxygen saturations were monitored continuously. The                            Colonoscope was introduced through the anus and                            advanced to the 2 cm into the ileum. The                            colonoscopy was technically  difficult and complex                            due to significant looping. Successful completion                            of the procedure was aided by applying abdominal                            pressure. The patient tolerated the procedure well.                            The quality of the bowel preparation was good                            except in some areas of the colon where there was                            adherent stool. Aggressive suctioning and                            aspiration was performed. The colon was highly                            redundant and somewhat difficult to examine. The                            terminal ileum, ileocecal valve, appendiceal                            orifice, and rectum were photographed. Scope In: 10:32:11 AM Scope Out: 10:56:49 AM Scope Withdrawal Time: 0 hours 10 minutes 57 seconds  Total Procedure Duration: 0 hours 24 minutes 38 seconds  Findings:                 Two sessile polyps were found in the proximal                            ascending colon. The polyps  were 4 to 6 mm in size.                            These polyps were removed with a cold snare.                            Resection and retrieval were complete. Estimated                            blood loss: none.                           A 12 mm polyp was found in the recto-sigmoid colon,                            15 cm from the anal verge. The polyp was                            semi-pedunculated. The polyp was removed with a hot                            snare. Resection and retrieval were complete.                            Estimated blood loss: none.                           A few small-mouthed diverticula were found in the                            sigmoid colon.                           The terminal ileum appeared normal.                           The exam was otherwise without abnormality. The                            colon was highly  redundant. Complications:            No immediate complications. Estimated Blood Loss:     Estimated blood loss: none. Impression:               -Colonic polyps s/p polypectomy.                           -Mild sigmoid diverticulosis.                           -Otherwise grossly normal colonoscopy to TI. The                            colon was highly redundant. Recommendation:           - Patient has a contact number available for  emergencies. The signs and symptoms of potential                            delayed complications were discussed with the                            patient. Return to normal activities tomorrow.                            Written discharge instructions were provided to the                            patient.                           - Resume previous diet.                           - Continue present medications.                           - No aspirin, ibuprofen, naproxen, or other                            non-steroidal anti-inflammatory drugs for 5 days                            after polyp removal.                           - Await pathology results.                           - Repeat colonoscopy for surveillance based on                            pathology results.                           - Return to GI clinic in 12 weeks. Jackquline Denmark, MD 02/09/2019 11:09:23 AM This report has been signed electronically.

## 2019-02-11 ENCOUNTER — Telehealth: Payer: Self-pay | Admitting: *Deleted

## 2019-02-11 NOTE — Telephone Encounter (Signed)
  Follow up Call-  Call back number 02/09/2019  Post procedure Call Back phone  # [phone]  Permission to leave phone message Yes  Some recent data might be hidden     Patient questions:  Do you have a fever, pain , or abdominal swelling? No. Pain Score  0 *  Have you tolerated food without any problems? Yes.    Have you been able to return to your normal activities? Yes.    Do you have any questions about your discharge instructions: Diet   No. Medications  No. Follow up visit  No.  Do you have questions or concerns about your Care? Yes.  questions regarding diverticulosis. Pt did not receive a handout will mail her one and one as well on High fiber.  Actions: * If pain score is 4 or above: No action needed, pain <4.  1. Have you developed a fever since your procedure? no  2.   Have you had an respiratory symptoms (SOB or cough) since your procedure? no  3.   Have you tested positive for COVID 19 since your procedure no  4.   Have you had any family members/close contacts diagnosed with the COVID 19 since your procedure?  no   If yes to any of these questions please route to Joylene John, RN and Alphonsa Gin, Therapist, sports.

## 2019-02-14 ENCOUNTER — Encounter: Payer: Self-pay | Admitting: Gastroenterology

## 2019-03-12 ENCOUNTER — Telehealth: Payer: Self-pay | Admitting: Gastroenterology

## 2019-03-12 DIAGNOSIS — R1013 Epigastric pain: Secondary | ICD-10-CM

## 2019-03-12 NOTE — Telephone Encounter (Signed)
Pt reported that she has burning pains post EGD.  Please advise.

## 2019-03-12 NOTE — Telephone Encounter (Signed)
Patient returned call to the office- patient has been to [facility] (urgent care)- been having burning pain in abdomen that radiate thru her abd to her back-patient was prescribed Bentyl (Q4hrs)-no relief, advised patient to talk with a dietician, advised to eat bland meals-meal changes have changed her bowel habits to having 3 bowel movements since 03/03/2019-has not tried any OTC-patient reports she feels like her stomach is "raw inside"-patient reports she is having harder than usual- RN advised patient to use stool softener-patient is requesting to have information from Dr. Lyndel Safe instead of [facility];  Patient feels like she is having a diverticulosis flare up- started having symptoms on 03/03/2019 after eating fried fish on 02/27/2019=please advise as patient is taking the Bentyl only when stomach pains are really bad-but will need a refill if she needs to continue on this medication; Bentyl is causing eye issues; patient admits to being under "a lot a lot of stress";   Please advise

## 2019-03-12 NOTE — Telephone Encounter (Signed)
Plan: -Take Bentyl 10 mg twice daily-half an hour before meals and bedtime.  Please call in 60, 2 refills. -Check CBC, CMP and lipase if not already done in urgent care.  If these are done in urgent care, please get the results. -CT Abdo/pelvis with p.o. and IV contrast prior to appointment with me. -Drink plenty of water.  Thx  RG

## 2019-03-12 NOTE — Telephone Encounter (Signed)
Left message for patient to call back to the office;  

## 2019-03-13 MED ORDER — DICYCLOMINE HCL 10 MG PO CAPS: 10.0000 mg | ORAL_CAPSULE | Freq: Two times a day (BID) | ORAL | 2 refills | Status: AC

## 2019-03-13 NOTE — Telephone Encounter (Signed)
Pt returning your call

## 2019-03-13 NOTE — Telephone Encounter (Signed)
CT abd/pel is scheduled for 11:00 am on 03/17/2019; patient will need to pick up contrast from [place] office when completing lab work needed for CT to be done;

## 2019-03-13 NOTE — Telephone Encounter (Signed)
Instructions for CT and contrast will be in lab at [facility] for patient pick up on Monday AM; patient is aware to pick these items up when getting lab work done on 03/16/2019; patient advised to call back to the office should questions/concerns arise;

## 2019-03-13 NOTE — Telephone Encounter (Signed)
Left message for patient to call back; Lab work was not done at [facility] in [place] or at PCP; patients appt for 03/16/2019 at 8:30 am has been rescheduled for 03/26/2019 at 9:30 am to ensure adequate time for lab work and CT scan to be ordered and completed for review prior to patient's appt; lab work and CT orders have been placed; CT abd/pel scan will be scheduled when patient returns call to the office;

## 2019-03-13 NOTE — Telephone Encounter (Signed)
Left message for patient to return call to the office; RX for Bentyl has been sent to patient's pharmacy of choice;

## 2019-03-16 ENCOUNTER — Other Ambulatory Visit (INDEPENDENT_AMBULATORY_CARE_PROVIDER_SITE_OTHER): Payer: Medicare Other

## 2019-03-16 ENCOUNTER — Ambulatory Visit: Payer: Medicare Other | Admitting: Gastroenterology

## 2019-03-16 DIAGNOSIS — R1013 Epigastric pain: Secondary | ICD-10-CM

## 2019-03-16 LAB — COMPREHENSIVE METABOLIC PANEL
ALT: 10 U/L (ref 0–35)
AST: 14 U/L (ref 0–37)
Albumin: 4.2 g/dL (ref 3.5–5.2)
Alkaline Phosphatase: 82 U/L (ref 39–117)
BUN: 11 mg/dL (ref 6–23)
CO2: 28 mEq/L (ref 19–32)
Calcium: 9.6 mg/dL (ref 8.4–10.5)
Chloride: 100 mEq/L (ref 96–112)
Creatinine, Ser: 0.51 mg/dL (ref 0.40–1.20)
GFR: 115.15 mL/min (ref >60.00)
Glucose, Bld: 100 mg/dL — ABNORMAL HIGH (ref 70–99)
Potassium: 4.7 mEq/L (ref 3.5–5.1)
Sodium: 135 mEq/L (ref 135–145)
Total Bilirubin: 0.8 mg/dL (ref 0.2–1.2)
Total Protein: 6.9 g/dL (ref 6.0–8.3)

## 2019-03-16 LAB — CBC WITH DIFFERENTIAL/PLATELET
Basophils Absolute: 0 10*3/uL (ref 0.0–0.1)
Basophils Relative: 0.5 % (ref 0.0–3.0)
Eosinophils Absolute: 0.1 10*3/uL (ref 0.0–0.7)
Eosinophils Relative: 2.5 % (ref 0.0–5.0)
HCT: 38 % (ref 36.0–46.0)
Hemoglobin: 13.2 g/dL (ref 12.0–15.0)
Lymphocytes Relative: 34.5 % (ref 12.0–46.0)
Lymphs Abs: 1.6 10*3/uL (ref 0.7–4.0)
MCHC: 34.7 g/dL (ref 30.0–36.0)
MCV: 89.9 fl (ref 78.0–100.0)
Monocytes Absolute: 0.4 10*3/uL (ref 0.1–1.0)
Monocytes Relative: 8.1 % (ref 3.0–12.0)
Neutro Abs: 2.6 10*3/uL (ref 1.4–7.7)
Neutrophils Relative %: 54.4 % (ref 43.0–77.0)
Platelets: 266 10*3/uL (ref 150.0–400.0)
RBC: 4.23 Mil/uL (ref 3.87–5.11)
RDW: 13.3 % (ref 11.5–15.5)
WBC: 4.8 10*3/uL (ref 4.0–10.5)

## 2019-03-16 LAB — LIPASE: Lipase: 11 U/L (ref 11.0–59.0)

## 2019-03-17 ENCOUNTER — Other Ambulatory Visit: Payer: Self-pay

## 2019-03-17 ENCOUNTER — Encounter (HOSPITAL_BASED_OUTPATIENT_CLINIC_OR_DEPARTMENT_OTHER): Payer: Self-pay

## 2019-03-17 ENCOUNTER — Ambulatory Visit (HOSPITAL_BASED_OUTPATIENT_CLINIC_OR_DEPARTMENT_OTHER)
Admission: RE | Admit: 2019-03-17 | Discharge: 2019-03-17 | Disposition: A | Discharge status: Home / Self Care | Payer: Medicare Other | Source: Ambulatory Visit | Attending: Gastroenterology | Admitting: Gastroenterology

## 2019-03-17 DIAGNOSIS — R1013 Epigastric pain: Secondary | ICD-10-CM | POA: Diagnosis present

## 2019-03-17 MED ORDER — IOHEXOL 300 MG/ML  SOLN
100.0000 mL | Freq: Once | INTRAMUSCULAR | Status: AC | PRN
  Administered 2019-03-17: 100 mL via INTRAVENOUS

## 2019-03-19 ENCOUNTER — Telehealth: Payer: Self-pay | Admitting: Gastroenterology

## 2019-03-19 NOTE — Telephone Encounter (Signed)
Pt has questions regarding "what was discussed yesterday."

## 2019-03-19 NOTE — Telephone Encounter (Signed)
Called and spoke with patient-patient is wanting clarification on Miralax dosing-information provided to patient; patient advised to call back to the office should questions/concerns arise;

## 2019-03-26 ENCOUNTER — Ambulatory Visit: Payer: Medicare Other | Admitting: Gastroenterology

## 2019-03-26 ENCOUNTER — Encounter: Payer: Self-pay | Admitting: Gastroenterology

## 2019-03-26 ENCOUNTER — Other Ambulatory Visit: Payer: Self-pay

## 2019-03-26 VITALS — BP 126/64 | HR 66 | Temp 98.2°F | Ht 61.0 in | Wt 104.2 lb

## 2019-03-26 DIAGNOSIS — K581 Irritable bowel syndrome with constipation: Secondary | ICD-10-CM

## 2019-03-26 DIAGNOSIS — F418 Other specified anxiety disorders: Secondary | ICD-10-CM | POA: Diagnosis not present

## 2019-03-26 DIAGNOSIS — R1013 Epigastric pain: Secondary | ICD-10-CM | POA: Diagnosis not present

## 2019-03-26 DIAGNOSIS — G8929 Other chronic pain: Secondary | ICD-10-CM

## 2019-03-26 DIAGNOSIS — K5790 Diverticulosis of intestine, part unspecified, without perforation or abscess without bleeding: Secondary | ICD-10-CM

## 2019-03-26 MED ORDER — OMEPRAZOLE 20 MG PO CPDR: 20.0000 mg | DELAYED_RELEASE_CAPSULE | Freq: Every day | ORAL | 3 refills | Status: AC | PRN

## 2019-03-26 MED ORDER — DICYCLOMINE HCL 10 MG PO CAPS: 10.0000 mg | ORAL_CAPSULE | Freq: Four times a day (QID) | ORAL | 3 refills | Status: AC | PRN
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
## 2019-03-26 NOTE — Patient Instructions (Addendum)
If you are age 83 or older, your body mass index should be between 23-30. Your Body mass index is 19.7 kg/m. If this is out of the aforementioned range listed, please consider follow up with your Primary Care Provider.  If you are age 23 or younger, your body mass index should be between 19-25. Your Body mass index is 19.7 kg/m. If this is out of the aformentioned range listed, please consider follow up with your Primary Care Provider.   To help prevent the possible spread of infection to our patients, communities, and staff; we will be implementing the following measures:  As of now we are not allowing any visitors/family members to accompany you to any upcoming appointments with [facility]. If you have any concerns about this please contact our office to discuss prior to the appointment.   We have sent the following medications to your pharmacy for you to pick up at your convenience: Omperazole 20mg  daily as needed. Bentyl 10mg  4 times daily as needed.  Begin a Lactose Free Diet. Eat cereal in the morning and warm water. Take a daily probiotic.  Please purchase the following medications over the counter and take as directed: Miralax 17g by mouth mixed with 8oz of liquid daily.  Thank you,  Dr. Jackquline Denmark

## 2020-04-26 ENCOUNTER — Encounter (HOSPITAL_COMMUNITY): Payer: Self-pay

## 2020-04-26 ENCOUNTER — Observation Stay (HOSPITAL_COMMUNITY)
Admission: EM | Admit: 2020-04-26 | Discharge: 2020-04-27 | Disposition: A | Discharge status: Home / Self Care | Payer: Medicare PPO | Attending: Family Medicine | Admitting: Family Medicine

## 2020-04-26 ENCOUNTER — Emergency Department (HOSPITAL_COMMUNITY): Payer: Medicare PPO

## 2020-04-26 ENCOUNTER — Other Ambulatory Visit: Payer: Self-pay

## 2020-04-26 DIAGNOSIS — Z20822 Contact with and (suspected) exposure to covid-19: Secondary | ICD-10-CM | POA: Insufficient documentation

## 2020-04-26 DIAGNOSIS — K219 Gastro-esophageal reflux disease without esophagitis: Secondary | ICD-10-CM | POA: Insufficient documentation

## 2020-04-26 DIAGNOSIS — F419 Anxiety disorder, unspecified: Secondary | ICD-10-CM | POA: Diagnosis present

## 2020-04-26 DIAGNOSIS — R1031 Right lower quadrant pain: Secondary | ICD-10-CM | POA: Insufficient documentation

## 2020-04-26 DIAGNOSIS — K589 Irritable bowel syndrome without diarrhea: Secondary | ICD-10-CM | POA: Diagnosis present

## 2020-04-26 DIAGNOSIS — R55 Syncope and collapse: Principal | ICD-10-CM | POA: Diagnosis present

## 2020-04-26 LAB — CBC WITH DIFFERENTIAL/PLATELET
Abs Immature Granulocytes: 0.05 10*3/uL (ref 0.00–0.07)
Basophils Absolute: 0 10*3/uL (ref 0.0–0.1)
Basophils Relative: 0 %
Eosinophils Absolute: 0 10*3/uL (ref 0.0–0.5)
Eosinophils Relative: 0 %
HCT: 37.5 % (ref 36.0–46.0)
Hemoglobin: 12.7 g/dL (ref 12.0–15.0)
Immature Granulocytes: 0 %
Lymphocytes Relative: 5 %
Lymphs Abs: 0.7 10*3/uL (ref 0.7–4.0)
MCH: 30.5 pg (ref 26.0–34.0)
MCHC: 33.9 g/dL (ref 30.0–36.0)
MCV: 90.1 fL (ref 80.0–100.0)
Monocytes Absolute: 0.7 10*3/uL (ref 0.1–1.0)
Monocytes Relative: 5 %
Neutro Abs: 12.1 10*3/uL — ABNORMAL HIGH (ref 1.7–7.7)
Neutrophils Relative %: 90 %
Platelets: 233 10*3/uL (ref 150–400)
RBC: 4.16 MIL/uL (ref 3.87–5.11)
RDW: 13.4 % (ref 11.5–15.5)
WBC: 13.6 10*3/uL — ABNORMAL HIGH (ref 4.0–10.5)
nRBC: 0 % (ref 0.0–0.2)

## 2020-04-26 LAB — TYPE AND SCREEN
ABO/RH(D): A POS
Antibody Screen: NEGATIVE

## 2020-04-26 LAB — COMPREHENSIVE METABOLIC PANEL
ALT: 18 U/L (ref 0–44)
AST: 21 U/L (ref 15–41)
Albumin: 3.7 g/dL (ref 3.5–5.0)
Alkaline Phosphatase: 79 U/L (ref 38–126)
Anion gap: 8 (ref 5–15)
BUN: 12 mg/dL (ref 8–23)
CO2: 24 mmol/L (ref 22–32)
Calcium: 8.9 mg/dL (ref 8.9–10.3)
Chloride: 104 mmol/L (ref 98–111)
Creatinine, Ser: 0.52 mg/dL (ref 0.44–1.00)
GFR calc non Af Amer: >60 mL/min (ref >60)
Glucose, Bld: 118 mg/dL — ABNORMAL HIGH (ref 70–99)
Potassium: 3.7 mmol/L (ref 3.5–5.1)
Sodium: 136 mmol/L (ref 135–145)
Total Bilirubin: 0.8 mg/dL (ref 0.3–1.2)
Total Protein: 6.5 g/dL (ref 6.5–8.1)

## 2020-04-26 LAB — URINALYSIS, ROUTINE W REFLEX MICROSCOPIC
Bilirubin Urine: NEGATIVE
Glucose, UA: NEGATIVE mg/dL
Hgb urine dipstick: NEGATIVE
Ketones, ur: NEGATIVE mg/dL
Leukocytes,Ua: NEGATIVE
Nitrite: NEGATIVE
Protein, ur: NEGATIVE mg/dL
Specific Gravity, Urine: 1.021 (ref 1.005–1.030)
pH: 7 (ref 5.0–8.0)

## 2020-04-26 LAB — LIPASE, BLOOD: Lipase: 26 U/L (ref 11–51)

## 2020-04-26 LAB — RESPIRATORY PANEL BY RT PCR (FLU A&B, COVID)
Influenza A by PCR: NEGATIVE
Influenza B by PCR: NEGATIVE
SARS Coronavirus 2 by RT PCR: NEGATIVE

## 2020-04-26 MED ORDER — ONDANSETRON HCL 4 MG/2ML IJ SOLN
4.0000 mg | Freq: Once | INTRAMUSCULAR | Status: AC
  Administered 2020-04-26: 4 mg via INTRAVENOUS
  Filled 2020-04-26: qty 2

## 2020-04-26 MED ORDER — IOHEXOL 300 MG/ML  SOLN
100.0000 mL | Freq: Once | INTRAMUSCULAR | Status: AC | PRN
  Administered 2020-04-26: 100 mL via INTRAVENOUS

## 2020-04-26 MED ORDER — SODIUM CHLORIDE 0.9 % IV SOLN: INTRAVENOUS | Status: DC

## 2020-04-26 MED ORDER — SODIUM CHLORIDE 0.9 % IV BOLUS
500.0000 mL | Freq: Once | INTRAVENOUS | Status: AC
  Administered 2020-04-26: 500 mL via INTRAVENOUS

## 2020-04-26 NOTE — H&P (Signed)
History and Physical    Elizabeth [name] NWG:[id number] DOB: 30-May-1934 DOA: 04/26/2020  PCP: Raina Mina., MD   Patient coming from: ILF   Chief Complaint: Syncope   HPI: Elizabeth [name] is a 86 y.o. female with medical history significant for anxiety, IBS, GERD, and chronic back pain with sciatica, now presenting to emergency department after syncopal episode.  Patient was in her usual state of health and having an uneventful day when she was seated in a chair around noon and developed acute onset of generalized weakness and lightheadedness, sweats, nausea, cramping lower abdominal discomfort. This was witnessed by a repairman who was working in her apartment and there was no shaking reported, no incontinence, and the patient denied any associated chest pain.  She had a bowel movement after the episode and the abdominal discomfort resolved after that. She continued to feel very weak in general and the repairman alerted a nurse at the [facility] who called EMS.  There was no melena or hematochezia. She has seen GI for chronic abdominal discomfort that was attributed to IBS; she is prescribed Bentyl for this but chooses not to take.   ED Course: Upon arrival to the ED, patient is found to be afebrile, saturating well on room air, and with stable blood pressure.  EKG features sinus rhythm with LAFB.  CT of the abdomen and pelvis is negative for acute intra-abdominal or pelvic abnormalities.  Chemistry panel is unremarkable.  CBC notable for leukocytosis to 13,600.  Urinalysis unremarkable.  COVID-19 PCR is negative.  Patient was treated with Zofran and IV fluids in the ED.  Review of Systems:  All other systems reviewed and apart from HPI, are negative.  Past Medical History:  Diagnosis Date  . Abdominal pain   . Allergic rhinitis   . Allergic urticaria   . Anxiety   . Arthritis   . Backache   . Breast cancer ([place])   . Cardiomegaly   . Esophageal reflux   . Fatigue   . IBS (irritable bowel  syndrome)   . Nephrolithiasis   . Osteoarthritis   . Rosacea   . UTI (urinary tract infection)     Past Surgical History:  Procedure Laterality Date  . CHOLECYSTECTOMY    . HERNIA REPAIR    . HIP SURGERY     x4  . MASTECTOMY Left 1986  . TONSILLECTOMY      Social History:   reports that she has never smoked. She has never used smokeless tobacco. She reports that she does not drink alcohol and does not use drugs.  Allergies  Allergen Reactions  . Cefdinir Other (See Comments)    Unknown  . Clotrimazole-Betamethasone   . Levofloxacin   . Penicillins     Family History  Problem Relation Age of Onset  . Lung cancer Mother   . Liver cancer Mother   . Kidney cancer Mother   . Colon cancer Neg Hx   . Esophageal cancer Neg Hx   . Rectal cancer Neg Hx   . Stomach cancer Neg Hx      Prior to Admission medications   Medication Sig Start Date End Date Taking? Authorizing Provider  acetaminophen (TYLENOL) 500 MG tablet Take 500 mg by mouth every 6 (six) hours as needed.    [provider]  dicyclomine (BENTYL) 10 MG capsule Take 1 capsule (10 mg total) by mouth 2 (two) times daily at 8 am and 10 pm. 03/13/19   Jackquline Denmark, MD  dicyclomine (BENTYL) 10 MG capsule Take 1 capsule (10 mg total) by mouth 4 (four) times daily as needed for spasms. 03/26/19   Jackquline Denmark, MD  omeprazole (PRILOSEC) 20 MG capsule Take 1 capsule (20 mg total) by mouth daily as needed. 03/26/19   Jackquline Denmark, MD  Probiotic Product (PROBIOTIC DAILY PO) Take 1 tablet by mouth daily as needed.     [provider]    Physical Exam: Vitals:   04/26/20 1845 04/26/20 1900 04/26/20 1930 04/26/20 1945  BP: (!) 131/58 (!) 138/120 133/68 (!) 156/66  Pulse: 75 81 70 85  Resp: 14 11 15 11   Temp:      TempSrc:      SpO2: 96% 98% 97% 99%  Height:        Constitutional: NAD, calm  Eyes: PERTLA, lids and conjunctivae normal ENMT: Mucous membranes are moist. Posterior pharynx clear of any  exudate or lesions.   Neck: normal, supple, no masses, no thyromegaly Respiratory: no wheezing, no crackles. No accessory muscle use.  Cardiovascular: S1 & S2 heard, regular rate and rhythm. No extremity edema.   Abdomen: No distension, no tenderness, soft. Bowel sounds active.  Musculoskeletal: no clubbing / cyanosis. No joint deformity upper and lower extremities.   Skin: no significant rashes, lesions, ulcers. Warm, dry, well-perfused. Neurologic: CN 2-12 grossly intact. Sensation intact. Moving all extremities.  Psychiatric: Alert and oriented to person, place, and situation. Pleasant and cooperative.    Labs and Imaging on Admission: I have personally reviewed following labs and imaging studies  CBC: Recent Labs  Lab 04/26/20 1636  WBC 13.6*  NEUTROABS 12.1*  HGB 12.7  HCT 37.5  MCV 90.1  PLT 175   Basic Metabolic Panel: Recent Labs  Lab 04/26/20 1636  NA 136  K 3.7  CL 104  CO2 24  GLUCOSE 118*  BUN 12  CREATININE 0.52  CALCIUM 8.9   GFR: CrCl cannot be calculated (Unknown ideal weight.). Liver Function Tests: Recent Labs  Lab 04/26/20 1636  AST 21  ALT 18  ALKPHOS 79  BILITOT 0.8  PROT 6.5  ALBUMIN 3.7   Recent Labs  Lab 04/26/20 1636  LIPASE 26   No results for input(s): AMMONIA in the last 168 hours. Coagulation Profile: No results for input(s): INR, PROTIME in the last 168 hours. Cardiac Enzymes: No results for input(s): CKTOTAL, CKMB, CKMBINDEX, TROPONINI in the last 168 hours. BNP (last 3 results) No results for input(s): PROBNP in the last 8760 hours. HbA1C: No results for input(s): HGBA1C in the last 72 hours. CBG: No results for input(s): GLUCAP in the last 168 hours. Lipid Profile: No results for input(s): CHOL, HDL, LDLCALC, TRIG, CHOLHDL, LDLDIRECT in the last 72 hours. Thyroid Function Tests: No results for input(s): TSH, T4TOTAL, FREET4, T3FREE, THYROIDAB in the last 72 hours. Anemia Panel: No results for input(s): VITAMINB12,  FOLATE, FERRITIN, TIBC, IRON, RETICCTPCT in the last 72 hours. Urine analysis:    Component Value Date/Time   COLORURINE STRAW (A) 04/26/2020 1924   APPEARANCEUR CLEAR 04/26/2020 1924   LABSPEC 1.021 04/26/2020 1924   PHURINE 7.0 04/26/2020 1924   GLUCOSEU NEGATIVE 04/26/2020 1924   HGBUR NEGATIVE 04/26/2020 [place] NEGATIVE 04/26/2020 [place] NEGATIVE 04/26/2020 1924   PROTEINUR NEGATIVE 04/26/2020 1924   UROBILINOGEN 0.2 11/04/2008 0940   NITRITE NEGATIVE 04/26/2020 1924   LEUKOCYTESUR NEGATIVE 04/26/2020 1924   Sepsis Labs: @LABRCNTIP (procalcitonin:4,lacticidven:4) ) Recent Results (from the past 240 hour(s))  Respiratory Panel by  RT PCR (Flu A&B, Covid) - Nasopharyngeal Swab     Status: None   Collection Time: 04/26/20  4:36 PM   Specimen: Nasopharyngeal Swab  Result Value Ref Range Status   SARS Coronavirus 2 by RT PCR NEGATIVE NEGATIVE Final    Comment: (NOTE) SARS-CoV-2 target nucleic acids are NOT DETECTED.  The SARS-CoV-2 RNA is generally detectable in upper respiratoy specimens during the acute phase of infection. The lowest concentration of SARS-CoV-2 viral copies this assay can detect is 131 copies/mL. A negative result does not preclude SARS-Cov-2 infection and should not be used as the sole basis for treatment or other patient management decisions. A negative result may occur with  improper specimen collection/handling, submission of specimen other than nasopharyngeal swab, presence of viral mutation(s) within the areas targeted by this assay, and inadequate number of viral copies (<131 copies/mL). A negative result must be combined with clinical observations, patient history, and epidemiological information. The expected result is Negative.  Fact Sheet for Patients:  PinkCheek.be  Fact Sheet for Healthcare Providers:  GravelBags.it  This test is no t yet approved or cleared by  the [place] FDA and  has been authorized for detection and/or diagnosis of SARS-CoV-2 by FDA under an Emergency Use Authorization (EUA). This EUA will remain  in effect (meaning this test can be used) for the duration of the COVID-19 declaration under Section 564(b)(1) of the Act, 21 U.S.C. section 360bbb-3(b)(1), unless the authorization is terminated or revoked sooner.     Influenza A by PCR NEGATIVE NEGATIVE Final   Influenza B by PCR NEGATIVE NEGATIVE Final    Comment: (NOTE) The Xpert Xpress SARS-CoV-2/FLU/RSV assay is intended as an aid in  the diagnosis of influenza from Nasopharyngeal swab specimens and  should not be used as a sole basis for treatment. Nasal washings and  aspirates are unacceptable for Xpert Xpress SARS-CoV-2/FLU/RSV  testing.  Fact Sheet for Patients: PinkCheek.be  Fact Sheet for Healthcare Providers: GravelBags.it  This test is not yet approved or cleared by the [place] FDA and  has been authorized for detection and/or diagnosis of SARS-CoV-2 by  FDA under an Emergency Use Authorization (EUA). This EUA will remain  in effect (meaning this test can be used) for the duration of the  Covid-19 declaration under Section 564(b)(1) of the Act, 21  U.S.C. section 360bbb-3(b)(1), unless the authorization is  terminated or revoked. Performed at [facility], [place] [address], [place], [place] [address]      Radiological Exams on Admission: CT ABDOMEN PELVIS W CONTRAST  Result Date: 04/26/2020 CLINICAL DATA:  Abdominal pain, acute, nonlocalized abd pain with syncope EXAM: CT ABDOMEN AND PELVIS WITH CONTRAST TECHNIQUE: Multidetector CT imaging of the abdomen and pelvis was performed using the standard protocol following bolus administration of intravenous contrast. CONTRAST:  117mL OMNIPAQUE IOHEXOL 300 MG/ML  SOLN COMPARISON:  CT abdomen pelvis 03/17/2019. FINDINGS: Lower  chest: Coronary artery calcifications versus mitral annular calcifications noted. Bilateral lower lobe linear atelectasis versus scarring. No acute abnormality. Hepatobiliary: Similar-appearing 2 cm fluid density lesion within the right hepatic lobe likely represents a simple hepatic cyst. Several subcentimeter hypodensities are again noted and are too small to characterize. Similar-appearing peripherally calcified hypodense lesion at the right hepatic dome (5:49). No focal liver abnormality. Status post cholecystectomy with similar-appearing prominent common bile duct and mild central intrahepatic biliary ductal dilatation. Pancreas: Diffusely atrophic. No focal lesion. Otherwise normal pancreatic contour. No surrounding inflammatory changes. No main pancreatic ductal dilatation. Spleen: Normal  in size without focal abnormality. Adrenals/Urinary Tract: No adrenal nodule bilaterally. Bilateral kidneys enhance symmetrically. Subcentimeter hypodensities are too small to characterize. No hydronephrosis. No hydroureter. The urinary bladder is grossly unremarkable in partially visualized due to streak artifact originating from right femoral surgical hardware. Stomach/Bowel: Stomach is within normal limits. The appendix is not definitely identified. No evidence of bowel wall thickening, distention, or inflammatory changes. Incomplete visualization of the rectum due to streak artifact originating from the right femoral surgical hardware. Vascular/Lymphatic: No abdominal aorta or iliac aneurysm. Severe atherosclerotic plaque of the aorta and its branches. The hepatic, portal, splenic, superior mesenteric veins are patent. No abdominal, pelvic, or inguinal lymphadenopathy. Reproductive: Atrophic uterus. Limited evaluation of the adnexal lesion regions due to streak artifact originating from the right surgical femoral hardware. Other: No intraperitoneal free fluid. No intraperitoneal free gas. No organized fluid collection.  Musculoskeletal: No abdominal wall hernia or abnormality Diffusely decreased bone density. No suspicious lytic or blastic osseous lesions. No acute displaced fracture. Hemangioma of the L4 vertebral body. Multilevel degenerative changes of the spine. Partially visualized total right hip arthroplasty with associated cerclage wire. IMPRESSION: 1. No acute intra-abdominal or intrapelvic abnormality. 2.  Aortic Atherosclerosis (ICD10-I70.0) -severe. Electronically Signed   By: Iven Finn M.D.   On: 04/26/2020 18:29    EKG: Independently reviewed. Sinus rhythm, LAFB.   Assessment/Plan   1. Syncope  - Patient presents after a brief LOC that occurred while seated and was associated with nausea, sweating, and cramping abdominal discomfort but no chest pain or palpitations, and no shaking or incontinence  - Description is most consistent with vasovagal syncope but EKG is abnormal  - Continue cardiac monitoring, check orthostatic vitals, check echocardiogram    DVT prophylaxis: Lovenox Code Status: Full  Family Communication: Discussed with patient   Disposition Plan:  Patient is from: ILF  Anticipated d/c is to: ILF  Anticipated d/c date is: 04/27/20 Patient currently: pending orthostatic vitals, cardiac monitoring, echocardiogram Consults called: None  Admission status: Observation     Vianne Bulls, MD [facility]  04/26/2020, 8:29 PM

## 2020-04-26 NOTE — ED Provider Notes (Signed)
[place] DEPT Provider Note   CSN: [id number] Arrival date & time: 04/26/20  1540     History Chief Complaint  Patient presents with  . Loss of Consciousness  . Abdominal Pain    Elizabeth [name] is a 86 y.o. female.  HPI   Pt states she was at home and was initially feeling fine.  She suddenly around noon she started to feel weak and had a syncopal episode.   Pt states it came on all of a sudden.  She was feeling sweaty and got weak and did not think she could hold her head up.  She had trouble seeing and communicating.  She was sitting in a chair. That lasted several minutes. EMS was called by someone that was in her home and she was awake but seemed confused.  She had stomach cramping after she passed out a couple of times.  Pt had a bowel movement and after that is feeling better.  No CP or SOB.  No blood in the stool.  No abd pain now.  No fevers.  No headache.  No vomiting but she was nauseated.    Past Medical History:  Diagnosis Date  . Abdominal pain   . Allergic rhinitis   . Allergic urticaria   . Anxiety   . Arthritis   . Backache   . Breast cancer ([place])   . Cardiomegaly   . Esophageal reflux   . Fatigue   . IBS (irritable bowel syndrome)   . Nephrolithiasis   . Osteoarthritis   . Rosacea   . UTI (urinary tract infection)     Patient Active Problem List   Diagnosis Date Noted  . Chronic cough 12/07/2011    Past Surgical History:  Procedure Laterality Date  . CHOLECYSTECTOMY    . HERNIA REPAIR    . HIP SURGERY     x4  . MASTECTOMY Left 1986  . TONSILLECTOMY       OB History   No obstetric history on file.     Family History  Problem Relation Age of Onset  . Lung cancer Mother   . Liver cancer Mother   . Kidney cancer Mother   . Colon cancer Neg Hx   . Esophageal cancer Neg Hx   . Rectal cancer Neg Hx   . Stomach cancer Neg Hx     Social History   Tobacco Use  . Smoking status: Never Smoker  .  Smokeless tobacco: Never Used  Vaping Use  . Vaping Use: Never used  Substance Use Topics  . Alcohol use: Never  . Drug use: Never    Home Medications Prior to Admission medications   Medication Sig Start Date End Date Taking? Authorizing Provider  acetaminophen (TYLENOL) 500 MG tablet Take 500 mg by mouth every 6 (six) hours as needed.    [provider]  dicyclomine (BENTYL) 10 MG capsule Take 1 capsule (10 mg total) by mouth 2 (two) times daily at 8 am and 10 pm. 03/13/19   Jackquline Denmark, MD  dicyclomine (BENTYL) 10 MG capsule Take 1 capsule (10 mg total) by mouth 4 (four) times daily as needed for spasms. 03/26/19   Jackquline Denmark, MD  omeprazole (PRILOSEC) 20 MG capsule Take 1 capsule (20 mg total) by mouth daily as needed. 03/26/19   Jackquline Denmark, MD  Probiotic Product (PROBIOTIC DAILY PO) Take 1 tablet by mouth daily as needed.     [provider]    Allergies  Cefdinir, Clotrimazole-betamethasone, Levofloxacin, and Penicillins  Review of Systems   Review of Systems  All other systems reviewed and are negative.   Physical Exam Updated Vital Signs BP (!) 156/66   Pulse 85   Temp 97.8 F (36.6 C) (Oral)   Resp 11   Ht 1.549 m (5\' 1" )   SpO2 99%   BMI 19.70 kg/m   Physical Exam Vitals and nursing note reviewed.  Constitutional:      General: She is not in acute distress.    Appearance: She is well-developed.  HENT:     Head: Normocephalic and atraumatic.     Right Ear: External ear normal.     Left Ear: External ear normal.  Eyes:     General: No scleral icterus.       Right eye: No discharge.        Left eye: No discharge.     Conjunctiva/sclera: Conjunctivae normal.  Neck:     Trachea: No tracheal deviation.  Cardiovascular:     Rate and Rhythm: Normal rate and regular rhythm.  Pulmonary:     Effort: Pulmonary effort is normal. No respiratory distress.     Breath sounds: Normal breath sounds. No stridor. No wheezing or rales.  Abdominal:      General: Bowel sounds are normal. There is no distension.     Palpations: Abdomen is soft.     Tenderness: There is generalized abdominal tenderness. There is no guarding or rebound.     Comments: Mild ttp diffusely  Musculoskeletal:        General: No tenderness.     Cervical back: Neck supple.  Skin:    General: Skin is warm and dry.     Findings: No rash.  Neurological:     General: No focal deficit present.     Mental Status: She is alert.     Cranial Nerves: No cranial nerve deficit (no facial droop, extraocular movements intact, no slurred speech).     Sensory: No sensory deficit.     Motor: No abnormal muscle tone or seizure activity.     Coordination: Coordination normal.     Comments: Alert, oriented, equal strength bilateral upper and lower extremities     ED Results / Procedures / Treatments   Labs (all labs ordered are listed, but only abnormal results are displayed) Labs Reviewed  COMPREHENSIVE METABOLIC PANEL - Abnormal; Notable for the following components:      Result Value   Glucose, Bld 118 (*)    All other components within normal limits  CBC WITH DIFFERENTIAL/PLATELET - Abnormal; Notable for the following components:   WBC 13.6 (*)    Neutro Abs 12.1 (*)    All other components within normal limits  URINALYSIS, ROUTINE W REFLEX MICROSCOPIC - Abnormal; Notable for the following components:   Color, Urine STRAW (*)    All other components within normal limits  RESPIRATORY PANEL BY RT PCR (FLU A&B, COVID)  LIPASE, BLOOD  TYPE AND SCREEN    EKG EKG Interpretation  Date/Time:  Tuesday April 26 2020 17:25:34 EDT Ventricular Rate:  73 PR Interval:    QRS Duration: 88 QT Interval:  387 QTC Calculation: 427 R Axis:   -46 Text Interpretation: Sinus rhythm Left anterior fascicular block No old tracing to compare Confirmed by Dorie Rank [phone]) on 04/26/2020 7:00:00 PM   Radiology CT ABDOMEN PELVIS W CONTRAST  Result Date: 04/26/2020 CLINICAL  DATA:  Abdominal pain, acute, nonlocalized abd pain with syncope EXAM:  CT ABDOMEN AND PELVIS WITH CONTRAST TECHNIQUE: Multidetector CT imaging of the abdomen and pelvis was performed using the standard protocol following bolus administration of intravenous contrast. CONTRAST:  152mL OMNIPAQUE IOHEXOL 300 MG/ML  SOLN COMPARISON:  CT abdomen pelvis 03/17/2019. FINDINGS: Lower chest: Coronary artery calcifications versus mitral annular calcifications noted. Bilateral lower lobe linear atelectasis versus scarring. No acute abnormality. Hepatobiliary: Similar-appearing 2 cm fluid density lesion within the right hepatic lobe likely represents a simple hepatic cyst. Several subcentimeter hypodensities are again noted and are too small to characterize. Similar-appearing peripherally calcified hypodense lesion at the right hepatic dome (5:49). No focal liver abnormality. Status post cholecystectomy with similar-appearing prominent common bile duct and mild central intrahepatic biliary ductal dilatation. Pancreas: Diffusely atrophic. No focal lesion. Otherwise normal pancreatic contour. No surrounding inflammatory changes. No main pancreatic ductal dilatation. Spleen: Normal in size without focal abnormality. Adrenals/Urinary Tract: No adrenal nodule bilaterally. Bilateral kidneys enhance symmetrically. Subcentimeter hypodensities are too small to characterize. No hydronephrosis. No hydroureter. The urinary bladder is grossly unremarkable in partially visualized due to streak artifact originating from right femoral surgical hardware. Stomach/Bowel: Stomach is within normal limits. The appendix is not definitely identified. No evidence of bowel wall thickening, distention, or inflammatory changes. Incomplete visualization of the rectum due to streak artifact originating from the right femoral surgical hardware. Vascular/Lymphatic: No abdominal aorta or iliac aneurysm. Severe atherosclerotic plaque of the aorta and its  branches. The hepatic, portal, splenic, superior mesenteric veins are patent. No abdominal, pelvic, or inguinal lymphadenopathy. Reproductive: Atrophic uterus. Limited evaluation of the adnexal lesion regions due to streak artifact originating from the right surgical femoral hardware. Other: No intraperitoneal free fluid. No intraperitoneal free gas. No organized fluid collection. Musculoskeletal: No abdominal wall hernia or abnormality Diffusely decreased bone density. No suspicious lytic or blastic osseous lesions. No acute displaced fracture. Hemangioma of the L4 vertebral body. Multilevel degenerative changes of the spine. Partially visualized total right hip arthroplasty with associated cerclage wire. IMPRESSION: 1. No acute intra-abdominal or intrapelvic abnormality. 2.  Aortic Atherosclerosis (ICD10-I70.0) -severe. Electronically Signed   By: Iven Finn M.D.   On: 04/26/2020 18:29    Procedures Procedures (including critical care time)  Medications Ordered in ED Medications  sodium chloride 0.9 % bolus 500 mL (0 mLs Intravenous Stopped 04/26/20 1810)    And  0.9 %  sodium chloride infusion ( Intravenous New Bag/Given 04/26/20 1816)  ondansetron (ZOFRAN) injection 4 mg (4 mg Intravenous Given 04/26/20 1633)  iohexol (OMNIPAQUE) 300 MG/ML solution 100 mL (100 mLs Intravenous Contrast Given 04/26/20 1749)    ED Course  I have reviewed the triage vital signs and the nursing notes.  Pertinent labs & imaging results that were available during my care of the patient were reviewed by me and considered in my medical decision making (see chart for details).  Clinical Course as of Apr 26 2021  Tue Apr 26, 2020  1859 CT scan does not show any acute finding.  Severe atherosclerosis is noted   [JK]    Clinical Course User Index [JK] Dorie Rank, MD   MDM Rules/Calculators/A&P                          Patient presented to the ED after a syncopal episode.  Patient symptoms were severe at the  time.  Patient felt extremely ill and states she thought she was going to die.  Fortunately her ED work-up is reassuring.  She has remained  stable while she is in the ED.  No signs of anemia.  No acute electrolyte abnormalities.   Final Clinical Impression(s) / ED Diagnoses Final diagnoses:  Syncope and collapse      Dorie Rank, MD 04/26/20 2025

## 2020-04-26 NOTE — ED Triage Notes (Signed)
Pt arrived via EMS, from home. Witnessed syncope PTA, pass out in chair, no head strike, states she felt very dizzy and knew she was going to pass out. On EMS arrival, pt awake but confused. C/o bilateral lower abd cramping. Denies any other sx.   Aox4 in triage

## 2020-04-27 ENCOUNTER — Observation Stay (HOSPITAL_BASED_OUTPATIENT_CLINIC_OR_DEPARTMENT_OTHER): Payer: Medicare PPO

## 2020-04-27 ENCOUNTER — Observation Stay (HOSPITAL_COMMUNITY): Payer: Medicare PPO

## 2020-04-27 DIAGNOSIS — I350 Nonrheumatic aortic (valve) stenosis: Secondary | ICD-10-CM | POA: Diagnosis not present

## 2020-04-27 DIAGNOSIS — R55 Syncope and collapse: Secondary | ICD-10-CM | POA: Diagnosis not present

## 2020-04-27 DIAGNOSIS — I361 Nonrheumatic tricuspid (valve) insufficiency: Secondary | ICD-10-CM | POA: Diagnosis not present

## 2020-04-27 LAB — BASIC METABOLIC PANEL
Anion gap: 8 (ref 5–15)
BUN: 9 mg/dL (ref 8–23)
CO2: 24 mmol/L (ref 22–32)
Calcium: 8.6 mg/dL — ABNORMAL LOW (ref 8.9–10.3)
Chloride: 106 mmol/L (ref 98–111)
Creatinine, Ser: 0.47 mg/dL (ref 0.44–1.00)
GFR calc non Af Amer: >60 mL/min (ref >60)
Glucose, Bld: 107 mg/dL — ABNORMAL HIGH (ref 70–99)
Potassium: 3.8 mmol/L (ref 3.5–5.1)
Sodium: 138 mmol/L (ref 135–145)

## 2020-04-27 LAB — ECHOCARDIOGRAM COMPLETE
AR max vel: 1.45 cm2
AV Area VTI: 1.31 cm2
AV Area mean vel: 1.42 cm2
AV Mean grad: 6 mmHg
AV Peak grad: 12.5 mmHg
Ao pk vel: 1.77 m/s
Area-P 1/2: 3.53 cm2
Calc EF: 69.3 %
Height: 61 in
S' Lateral: 2.4 cm
Single Plane A2C EF: 67.1 %
Single Plane A4C EF: 73.2 %

## 2020-04-27 LAB — CBC
HCT: 33.4 % — ABNORMAL LOW (ref 36.0–46.0)
Hemoglobin: 11.9 g/dL — ABNORMAL LOW (ref 12.0–15.0)
MCH: 30.8 pg (ref 26.0–34.0)
MCHC: 35.6 g/dL (ref 30.0–36.0)
MCV: 86.5 fL (ref 80.0–100.0)
Platelets: 234 10*3/uL (ref 150–400)
RBC: 3.86 MIL/uL — ABNORMAL LOW (ref 3.87–5.11)
RDW: 13.7 % (ref 11.5–15.5)
WBC: 9.4 10*3/uL (ref 4.0–10.5)
nRBC: 0 % (ref 0.0–0.2)

## 2020-04-27 LAB — TROPONIN I (HIGH SENSITIVITY): Troponin I (High Sensitivity): 3 ng/L (ref <18)

## 2020-04-27 MED ORDER — ACETAMINOPHEN 650 MG RE SUPP: 650.0000 mg | Freq: Four times a day (QID) | RECTAL | Status: DC | PRN

## 2020-04-27 MED ORDER — SODIUM CHLORIDE 0.9 % IV SOLN: 250.0000 mL | INTRAVENOUS | Status: DC | PRN

## 2020-04-27 MED ORDER — ACETAMINOPHEN 325 MG PO TABS: 650.0000 mg | ORAL_TABLET | Freq: Four times a day (QID) | ORAL | Status: DC | PRN

## 2020-04-27 MED ORDER — ONDANSETRON HCL 4 MG/2ML IJ SOLN: 4.0000 mg | Freq: Four times a day (QID) | INTRAMUSCULAR | Status: DC | PRN

## 2020-04-27 MED ORDER — SODIUM CHLORIDE 0.9% FLUSH
3.0000 mL | Freq: Two times a day (BID) | INTRAVENOUS | Status: DC
  Administered 2020-04-27 (×2): 3 mL via INTRAVENOUS

## 2020-04-27 MED ORDER — ONDANSETRON HCL 4 MG PO TABS: 4.0000 mg | ORAL_TABLET | Freq: Four times a day (QID) | ORAL | Status: DC | PRN

## 2020-04-27 MED ORDER — POLYETHYLENE GLYCOL 3350 17 G PO PACK: 17.0000 g | PACK | Freq: Every day | ORAL | Status: DC | PRN

## 2020-04-27 MED ORDER — SODIUM CHLORIDE 0.9% FLUSH: 3.0000 mL | INTRAVENOUS | Status: DC | PRN

## 2020-04-27 MED ORDER — ENOXAPARIN SODIUM 40 MG/0.4ML ~~LOC~~ SOLN
40.0000 mg | SUBCUTANEOUS | Status: DC
  Administered 2020-04-27: 40 mg via SUBCUTANEOUS
  Filled 2020-04-27: qty 0.4

## 2020-04-27 NOTE — ED Notes (Signed)
Patient provided lunch tray and sprite per her request.

## 2020-04-27 NOTE — Progress Notes (Signed)
  Echocardiogram 2D Echocardiogram has been performed.  Elizabeth [name] 04/27/2020, 1:55 PM

## 2020-04-27 NOTE — Evaluation (Signed)
Physical Therapy Evaluation Patient Details Name: RYNLEE LISBON MRN: [id number] DOB: Dec 21, 1933 Today's Date: 04/27/2020   History of Present Illness  Elizabeth [name] is a 86 y.o. female with medical history significant for anxiety, IBS, GERD, and chronic back pain with sciatica, now presenting to emergency department after syncopal episode.  Patient was in her usual state of health and having an uneventful day when she was seated in a chair around noon and developed acute onset of generalized weakness and lightheadedness, sweats, nausea, cramping lower abdominal discomfort. This was witnessed by a repairman who was working in her apartment and there was no shaking reported, no incontinence, and the patient denied any associated chest pain.  She had a bowel movement after the episode and the abdominal discomfort resolved after that. She continued to feel very weak in general and the repairman alerted a nurse at the [facility] who called EMS.    Clinical Impression  JATASIA GUNDRUM is 86 y.o. female admitted with above HPI and diagnosis. Patient is currently limited by functional impairments below (see PT problem list). Patient lives alone and is modified independent with use of wheelchair for long distance mobility at baseline. Patient required min guard/supervision for transfers and gait with no assistive device. Patient will benefit from continued skilled PT interventions to address impairments and progress independence with mobility, recommending HHPT vs OPPT to address knee pain and sciatica depending on pt preference. Acute PT will follow and progress as able.   Orthostatic Vital Assessed: pt denied symptoms throughout session BP          Position 138/62    Supine 143/70    Seated 146/85    Standing    Follow Up Recommendations Home health PT;Outpatient PT (HHPT vs OPPT for sciatica)    Equipment Recommendations  None recommended by PT    Recommendations for Other Services        Precautions / Restrictions Precautions Precautions: Fall Restrictions Weight Bearing Restrictions: No      Mobility  Bed Mobility Overal bed mobility: Needs Assistance Bed Mobility: Sit to Supine;Supine to Sit     Supine to sit: Supervision Sit to supine: Supervision   General bed mobility comments: no assist required, pt taking extra time to bring LE's on/off of bed.  Transfers Overall transfer level: Needs assistance Equipment used: Rolling walker (2 wheeled) Transfers: Sit to/from Stand Sit to Stand: Min guard         General transfer comment: no assist required for power up and pt steady in standing. guarding for safety.  Ambulation/Gait Ambulation/Gait assistance: Min guard Gait Distance (Feet): 20 Feet Assistive device: Rolling walker (2 wheeled);None Gait Pattern/deviations: Step-through pattern;Decreased step length - right;Decreased step length - left;Decreased stride length;Trunk flexed Gait velocity: decr   General Gait Details: cues for use of RW, no overt LOB noted with gait in room. pt amb to sink and then back to bed with no device, no LOB. VSS throughout.  Stairs            Wheelchair Mobility    Modified Rankin (Stroke Patients Only)       Balance Overall balance assessment: Mild deficits observed, not formally tested                    Pertinent Vitals/Pain Pain Assessment: Faces Faces Pain Scale: Hurts a little bit Pain Location: knees with mobility Pain Descriptors / Indicators: Aching;Discomfort Pain Intervention(s): Limited activity within patient's tolerance;Monitored during session;Repositioned    Home  Living Family/patient expects to be discharged to:: Private residence ([facility]) Living Arrangements: Alone Available Help at Discharge: Friend(s) Type of Home: Apartment Home Access: Level entry;Ramped entrance     Home Layout: One level Home Equipment: Wheelchair - [employer] - 2 wheels;Shower seat -  built in;Grab bars - tub/shower;Hand held shower head      Prior Function Level of Independence: Independent with assistive device(s)         Comments: pt has been using a wheelchair for long distance mobility and pushes it herself. she ambulates short distances in home with no device but is limited by bil knee pain.      Hand Dominance   Dominant Hand: Right    Extremity/Trunk Assessment   Upper Extremity Assessment Upper Extremity Assessment: Overall WFL for tasks assessed    Lower Extremity Assessment Lower Extremity Assessment: Generalized weakness    Cervical / Trunk Assessment Cervical / Trunk Assessment: Normal  Communication   Communication: No difficulties  Cognition Arousal/Alertness: Awake/alert Behavior During Therapy: WFL for tasks assessed/performed Overall Cognitive Status: Within Functional Limits for tasks assessed                       General Comments      Exercises     Assessment/Plan    PT Assessment Patient needs continued PT services  PT Problem List Decreased strength;Decreased activity tolerance;Decreased mobility;Decreased balance;Decreased knowledge of use of DME;Pain       PT Treatment Interventions DME instruction;Gait training;Functional mobility training;Therapeutic activities;Therapeutic exercise;Balance training;Patient/family education    PT Goals (Current goals can be found in the Care Plan section)  Acute Rehab PT Goals Patient Stated Goal: get out of here and go home PT Goal Formulation: With patient Time For Goal Achievement: 05/11/20 Potential to Achieve Goals: Good    Frequency Min 3X/week   Barriers to discharge           AM-PAC PT "6 Clicks" Mobility  Outcome Measure Help needed turning from your back to your side while in a flat bed without using bedrails?: None Help needed moving from lying on your back to sitting on the side of a flat bed without using bedrails?: A Little Help needed moving to and  from a bed to a chair (including a wheelchair)?: A Little Help needed standing up from a chair using your arms (e.g., wheelchair or bedside chair)?: A Little Help needed to walk in hospital room?: A Little Help needed climbing 3-5 steps with a railing? : A Lot 6 Click Score: 18    End of Session Equipment Utilized During Treatment: Gait belt Activity Tolerance: Patient tolerated treatment well Patient left: in bed;with call bell/phone within reach (x-ray tech in room) Nurse Communication: Mobility status PT Visit Diagnosis: Difficulty in walking, not elsewhere classified (R26.2)    Time: 7416-3845 PT Time Calculation (min) (ACUTE ONLY): 26 min   Charges:   PT Evaluation $PT Eval Low Complexity: 1 Low PT Treatments $Gait Training: 8-22 mins       Verner Mould, DPT Acute Rehabilitation Services  Office [phone] Pager [phone]  04/27/2020 3:20 PM

## 2020-04-27 NOTE — ED Notes (Signed)
Pt placed on bedpan

## 2020-04-27 NOTE — Discharge Summary (Signed)
Physician Discharge Summary  [name] KGM:[id number] DOB: 12/06/1933 DOA: 04/26/2020  PCP: Raina Mina., MD  Admit date: 04/26/2020 Discharge date: 04/27/2020  Time spent: 40 minutes  Recommendations for Outpatient Follow-up:  1. Follow outpatient CBC/CMP 2. Follow PCP outpatient for syncope 3. Follow mild aortic stenosis outpatient  4. Follow home health therapy outpatient   5. Follow with PCP outpatient regarding aortic atherosclerosis and discussion of treatment  Discharge Diagnoses:  Principal Problem:   Syncope Active Problems:   Anxiety   IBS (irritable bowel syndrome)   Discharge Condition: stable  Diet recommendation: heart healthy  There were no vitals filed for this visit.  History of present illness:  Elizabeth [name] is Elizabeth [name] 86 y.o. female with medical history significant for anxiety, IBS, GERD, and chronic back pain with sciatica, now presenting to emergency department after syncopal episode.  Patient was in her usual state of health and having an uneventful day when she was seated in Jonas Goh chair around noon and developed acute onset of generalized weakness and lightheadedness, sweats, nausea, cramping lower abdominal discomfort. This was witnessed by Breah Joa repairman who was working in her apartment and there was no shaking reported, no incontinence, and the patient denied any associated chest pain.  She had Markale Birdsell bowel movement after the episode and the abdominal discomfort resolved after that. She continued to feel very weak in general and the repairman alerted Meng Winterton nurse at the [facility] who called EMS.  There was no melena or hematochezia. She has seen GI for chronic abdominal discomfort that was attributed to IBS; she is prescribed Bentyl for this but chooses not to take.   ED Course: Upon arrival to the ED, patient is found to be afebrile, saturating well on room air, and with stable blood pressure.  EKG features sinus rhythm with LAFB.  CT of the abdomen and pelvis is negative  for acute intra-abdominal or pelvic abnormalities.  Chemistry panel is unremarkable.  CBC notable for leukocytosis to 13,600.  Urinalysis unremarkable.  COVID-19 PCR is negative.  Patient was treated with Zofran and IV fluids in the ED.  She was admitted due to syncope.  This was accompanied by abdominal discomfort and urge to have Anubis Fundora bowel movement.  Abdominal discomfort resolved after she had Rochanda Harpham bowel movement.  She's been worked up here, which has been reassuring.  Based on story, suspect her presyncopal episode may have been reflex syncope/vasovagal related to need to defecate.  Hospital Course:  Syncope: suspect reflex syncope in setting of her need to defecate at the time.  She notes she didn't eat that morning, which could have played Birdella Sippel role.  Workup here has been reassuring.  CT abdomen/pelvis without acute intraabdominal or intrapelvic abnormality.  EKG without any priors for comparison, but notable for LAFB, normal QTc.  Echo was without any concerning findings, mild aortic stenosis.  Troponin x1 negative (several hours after presentation).  Negative orthostatics.  Her CXR did not show any acute process in the chest.  She's improved today and feeling back to baseline.  She's eager to discharge home.    Severe Aortic Atherosclerosis: follow outpatient and discuss potential therapy  Procedures: Echo IMPRESSIONS    1. Left ventricular ejection fraction, by estimation, is 60 to 65%. The  left ventricle has normal function. The left ventricle has no regional  wall motion abnormalities. Left ventricular diastolic parameters were  normal.  2. Right ventricular systolic function is normal. The right ventricular  size is normal.  3. The  mitral valve is degenerative. No evidence of mitral valve  regurgitation. No evidence of mitral stenosis. Severe mitral annular  calcification.  4. The aortic valve is tricuspid. There is moderate calcification of the  aortic valve. There is moderate  thickening of the aortic valve. Aortic  valve regurgitation is not visualized. Mild aortic valve stenosis.  5. The inferior vena cava is normal in size with greater than 50%  respiratory variability, suggesting right atrial pressure of 3 mmHg.   Consultations:  none  Discharge Exam: Vitals:   04/27/20 1300 04/27/20 1500  BP: (!) 123/53 (!) 122/54  Pulse: 66 65  Resp: 16 17  Temp:    SpO2: 99% 97%   Feeling improved Eager to go home.  Doesn't want too many extra tests.  We discussed echo at length, she was finally agreeable to this.  General: No acute distress. Cardiovascular: Heart sounds show Jeily Guthridge regular rate, and rhythm.  Lungs: unlabored Abdomen: Soft, nontender, nondistended Neurological: Alert and oriented 3. Moves all extremities 4. Cranial nerves II through XII grossly intact. Skin: Warm and dry. No rashes or lesions. Extremities: No clubbing or cyanosis. No edema.   Discharge Instructions   Discharge Instructions    Call MD for:  difficulty breathing, headache or visual disturbances   Complete by: As directed    Call MD for:  extreme fatigue   Complete by: As directed    Call MD for:  hives   Complete by: As directed    Call MD for:  persistant dizziness or light-headedness   Complete by: As directed    Call MD for:  persistant nausea and vomiting   Complete by: As directed    Call MD for:  redness, tenderness, or signs of infection (pain, swelling, redness, odor or green/yellow discharge around incision site)   Complete by: As directed    Call MD for:  severe uncontrolled pain   Complete by: As directed    Call MD for:  temperature >100.4   Complete by: As directed    Diet - low sodium heart healthy   Complete by: As directed    Discharge instructions   Complete by: As directed    You were seen for an episode of syncope (passing out).  Your symptoms are resolved at this point.    Based on your history, I think this was probably vasovagal syncope,  possibly related to your need to have Sumedh Shinsato bowel movement.  It may have also been related to your not eating.  Your ultrasound of your heart was reassuring as well as your labs and EKG.    Your CT scan showed aortic atherosclerosis.  You can discuss this in follow up with your PCP.  Return for new, recurrent, or worsening symptoms.  We'll send you home with home health PT.   Please call your PCP for Alura Olveda follow up appointment in Myl[place] Gentry few days.  Please ask your PCP to request records from this hospitalization so they know what was done and what the next steps will be.   Increase activity slowly   Complete by: As directed      Allergies as of 04/27/2020      Reactions   Cefdinir Other (See Comments)   Unknown   Clotrimazole-betamethasone    unknown   Levofloxacin Hives   Penicillins Hives      Medication List    STOP taking these medications   acetaminophen 500 MG tablet Commonly known as: TYLENOL     TAKE  these medications   dicyclomine 10 MG capsule Commonly known as: BENTYL Take 1 capsule (10 mg total) by mouth 2 (two) times daily at 8 am and 10 pm.   dicyclomine 10 MG capsule Commonly known as: BENTYL Take 1 capsule (10 mg total) by mouth 4 (four) times daily as needed for spasms.   LACTAID PO Take 1 tablet by mouth daily as needed (for dairy products).   omeprazole 20 MG capsule Commonly known as: PRILOSEC Take 1 capsule (20 mg total) by mouth daily as needed.      Allergies  Allergen Reactions  . Cefdinir Other (See Comments)    Unknown  . Clotrimazole-Betamethasone     unknown  . Levofloxacin Hives  . Penicillins Hives      The results of significant diagnostics from this hospitalization (including imaging, microbiology, ancillary and laboratory) are listed below for reference.    Significant Diagnostic Studies: CT ABDOMEN PELVIS W CONTRAST  Result Date: 04/26/2020 CLINICAL DATA:  Abdominal pain, acute, nonlocalized abd pain with syncope EXAM: CT ABDOMEN  AND PELVIS WITH CONTRAST TECHNIQUE: Multidetector CT imaging of the abdomen and pelvis was performed using the standard protocol following bolus administration of intravenous contrast. CONTRAST:  177mL OMNIPAQUE IOHEXOL 300 MG/ML  SOLN COMPARISON:  CT abdomen pelvis 03/17/2019. FINDINGS: Lower chest: Coronary artery calcifications versus mitral annular calcifications noted. Bilateral lower lobe linear atelectasis versus scarring. No acute abnormality. Hepatobiliary: Similar-appearing 2 cm fluid density lesion within the right hepatic lobe likely represents Dominie Benedick simple hepatic cyst. Several subcentimeter hypodensities are again noted and are too small to characterize. Similar-appearing peripherally calcified hypodense lesion at the right hepatic dome (5:49). No focal liver abnormality. Status post cholecystectomy with similar-appearing prominent common bile duct and mild central intrahepatic biliary ductal dilatation. Pancreas: Diffusely atrophic. No focal lesion. Otherwise normal pancreatic contour. No surrounding inflammatory changes. No main pancreatic ductal dilatation. Spleen: Normal in size without focal abnormality. Adrenals/Urinary Tract: No adrenal nodule bilaterally. Bilateral kidneys enhance symmetrically. Subcentimeter hypodensities are too small to characterize. No hydronephrosis. No hydroureter. The urinary bladder is grossly unremarkable in partially visualized due to streak artifact originating from right femoral surgical hardware. Stomach/Bowel: Stomach is within normal limits. The appendix is not definitely identified. No evidence of bowel wall thickening, distention, or inflammatory changes. Incomplete visualization of the rectum due to streak artifact originating from the right femoral surgical hardware. Vascular/Lymphatic: No abdominal aorta or iliac aneurysm. Severe atherosclerotic plaque of the aorta and its branches. The hepatic, portal, splenic, superior mesenteric veins are patent. No  abdominal, pelvic, or inguinal lymphadenopathy. Reproductive: Atrophic uterus. Limited evaluation of the adnexal lesion regions due to streak artifact originating from the right surgical femoral hardware. Other: No intraperitoneal free fluid. No intraperitoneal free gas. No organized fluid collection. Musculoskeletal: No abdominal wall hernia or abnormality Diffusely decreased bone density. No suspicious lytic or blastic osseous lesions. No acute displaced fracture. Hemangioma of the L4 vertebral body. Multilevel degenerative changes of the spine. Partially visualized total right hip arthroplasty with associated cerclage wire. IMPRESSION: 1. No acute intra-abdominal or intrapelvic abnormality. 2.  Aortic Atherosclerosis (ICD10-I70.0) -severe. Electronically Signed   By: Iven Finn M.D.   On: 04/26/2020 18:29   DG CHEST PORT 1 VIEW  Result Date: 04/27/2020 CLINICAL DATA:  Syncope EXAM: PORTABLE CHEST 1 VIEW COMPARISON:  2018 FINDINGS: The heart size and mediastinal contours are within normal limits. No new consolidation or edema. No pleural effusion or pneumothorax. The visualized skeletal structures are unremarkable. IMPRESSION: No acute process in  the chest. Electronically Signed   By: Macy Mis M.D.   On: 04/27/2020 13:16   ECHOCARDIOGRAM COMPLETE  Result Date: 04/27/2020    ECHOCARDIOGRAM REPORT   Patient Name:   TANGELIA SANSON Date of Exam: 04/27/2020 Medical Rec #:  [id number]        Height:       61.0 in Accession #:    [id number]       Weight:       104.2 lb Date of Birth:  1933/12/03        BSA:          1.432 m Patient Age:    86 years         BP:           140/60 mmHg Patient Gender: F                HR:           64 bpm. Exam Location:  Inpatient Procedure: 2D Echo, Cardiac Doppler and Color Doppler Indications:    R55 Syncope  History:        Patient has no prior history of Echocardiogram examinations. No                 prior cardiac history.  Sonographer:    Roseanna Rainbow RDCS Referring  Phys: [id number] [place]  1. Left ventricular ejection fraction, by estimation, is 60 to 65%. The left ventricle has normal function. The left ventricle has no regional wall motion abnormalities. Left ventricular diastolic parameters were normal.  2. Right ventricular systolic function is normal. The right ventricular size is normal.  3. The mitral valve is degenerative. No evidence of mitral valve regurgitation. No evidence of mitral stenosis. Severe mitral annular calcification.  4. The aortic valve is tricuspid. There is moderate calcification of the aortic valve. There is moderate thickening of the aortic valve. Aortic valve regurgitation is not visualized. Mild aortic valve stenosis.  5. The inferior vena cava is normal in size with greater than 50% respiratory variability, suggesting right atrial pressure of 3 mmHg. FINDINGS  Left Ventricle: Left ventricular ejection fraction, by estimation, is 60 to 65%. The left ventricle has normal function. The left ventricle has no regional wall motion abnormalities. The left ventricular internal cavity size was normal in size. There is  no left ventricular hypertrophy. Left ventricular diastolic parameters were normal. Right Ventricle: The right ventricular size is normal. No increase in right ventricular wall thickness. Right ventricular systolic function is normal. Left Atrium: Left atrial size was normal in size. Right Atrium: Right atrial size was normal in size. Pericardium: There is no evidence of pericardial effusion. Mitral Valve: The mitral valve is degenerative in appearance. There is mild thickening of the mitral valve leaflet(s). There is mild calcification of the mitral valve leaflet(s). Severe mitral annular calcification. No evidence of mitral valve regurgitation. No evidence of mitral valve stenosis. Tricuspid Valve: The tricuspid valve is normal in structure. Tricuspid valve regurgitation is mild . No evidence of tricuspid stenosis.  Aortic Valve: The aortic valve is tricuspid. There is moderate calcification of the aortic valve. There is moderate thickening of the aortic valve. There is moderate aortic valve annular calcification. Aortic valve regurgitation is not visualized. Mild aortic stenosis is present. Aortic valve mean gradient measures 6.0 mmHg. Aortic valve peak gradient measures 12.5 mmHg. Aortic valve area, by VTI measures 1.31 cm. Pulmonic Valve: The pulmonic valve was normal in structure. Pulmonic valve  regurgitation is not visualized. No evidence of pulmonic stenosis. Aorta: The aortic root is normal in size and structure. Venous: The inferior vena cava is normal in size with greater than 50% respiratory variability, suggesting right atrial pressure of 3 mmHg. IAS/Shunts: No atrial level shunt detected by color flow Doppler.  LEFT VENTRICLE PLAX 2D LVIDd:         4.30 cm     Diastology LVIDs:         2.40 cm     LV e' medial:    8.49 cm/s LV PW:         1.10 cm     LV E/e' medial:  7.2 LV IVS:        1.00 cm     LV e' lateral:   9.90 cm/s LVOT diam:     1.80 cm     LV E/e' lateral: 6.2 LV SV:         54 LV SV Index:   38 LVOT Area:     2.54 cm  LV Volumes (MOD) LV vol d, MOD A2C: 60.4 ml LV vol d, MOD A4C: 52.2 ml LV vol s, MOD A2C: 19.9 ml LV vol s, MOD A4C: 14.0 ml LV SV MOD A2C:     40.5 ml LV SV MOD A4C:     52.2 ml LV SV MOD BP:      38.8 ml RIGHT VENTRICLE             IVC RV S prime:     16.20 cm/s  IVC diam: 1.10 cm TAPSE (M-mode): 2.3 cm LEFT ATRIUM             Index       RIGHT ATRIUM           Index LA diam:        2.90 cm 2.03 cm/m  RA Area:     14.80 cm LA Vol (A2C):   35.8 ml 25.00 ml/m RA Volume:   35.70 ml  24.93 ml/m LA Vol (A4C):   27.0 ml 18.86 ml/m LA Biplane Vol: 32.7 ml 22.84 ml/m  AORTIC VALVE AV Area (Vmax):    1.45 cm AV Area (Vmean):   1.42 cm AV Area (VTI):     1.31 cm AV Vmax:           177.00 cm/s AV Vmean:          118.000 cm/s AV VTI:            0.416 m AV Peak Grad:      12.5 mmHg AV Mean  Grad:      6.0 mmHg LVOT Vmax:         101.00 cm/s LVOT Vmean:        65.800 cm/s LVOT VTI:          0.214 m LVOT/AV VTI ratio: 0.51  AORTA Ao Root diam: 3.00 cm Ao Asc diam:  3.10 cm MITRAL VALVE MV Area (PHT): 3.53 cm    SHUNTS MV Decel Time: 215 msec    Systemic VTI:  0.21 m MV E velocity: 61.00 cm/s  Systemic Diam: 1.80 cm MV Monica Zahler velocity: 49.80 cm/s MV E/Dalaney Needle ratio:  1.22 Jenkins Rouge MD Electronically signed by Jenkins Rouge MD Signature Date/Time: 04/27/2020/2:03:36 PM    Final     Microbiology: Recent Results (from the past 240 hour(s))  Respiratory Panel by RT PCR (Flu Graesyn Schreifels&B, Covid) - Nasopharyngeal Swab     Status: None  Collection Time: 04/26/20  4:36 PM   Specimen: Nasopharyngeal Swab  Result Value Ref Range Status   SARS Coronavirus 2 by RT PCR NEGATIVE NEGATIVE Final    Comment: (NOTE) SARS-CoV-2 target nucleic acids are NOT DETECTED.  The SARS-CoV-2 RNA is generally detectable in upper respiratoy specimens during the acute phase of infection. The lowest concentration of SARS-CoV-2 viral copies this assay can detect is 131 copies/mL. Velora Horstman negative result does not preclude SARS-Cov-2 infection and should not be used as the sole basis for treatment or other patient management decisions. Mycala Warshawsky negative result may occur with  improper specimen collection/handling, submission of specimen other than nasopharyngeal swab, presence of viral mutation(s) within the areas targeted by this assay, and inadequate number of viral copies (<131 copies/mL). Kamarri Lovvorn negative result must be combined with clinical observations, patient history, and epidemiological information. The expected result is Negative.  Fact Sheet for Patients:  PinkCheek.be  Fact Sheet for Healthcare Providers:  GravelBags.it  This test is no t yet approved or cleared by the [place] FDA and  has been authorized for detection and/or diagnosis of SARS-CoV-2 by FDA under an  Emergency Use Authorization (EUA). This EUA will remain  in effect (meaning this test can be used) for the duration of the COVID-19 declaration under Section 564(b)(1) of the Act, 21 U.S.C. section 360bbb-3(b)(1), unless the authorization is terminated or revoked sooner.     Influenza Sharvil Hoey by PCR NEGATIVE NEGATIVE Final   Influenza B by PCR NEGATIVE NEGATIVE Final    Comment: (NOTE) The Xpert Xpress SARS-CoV-2/FLU/RSV assay is intended as an aid in  the diagnosis of influenza from Nasopharyngeal swab specimens and  should not be used as Alexandre Faries sole basis for treatment. Nasal washings and  aspirates are unacceptable for Xpert Xpress SARS-CoV-2/FLU/RSV  testing.  Fact Sheet for Patients: PinkCheek.be  Fact Sheet for Healthcare Providers: GravelBags.it  This test is not yet approved or cleared by the [place] FDA and  has been authorized for detection and/or diagnosis of SARS-CoV-2 by  FDA under an Emergency Use Authorization (EUA). This EUA will remain  in effect (meaning this test can be used) for the duration of the  Covid-19 declaration under Section 564(b)(1) of the Act, 21  U.S.C. section 360bbb-3(b)(1), unless the authorization is  terminated or revoked. Performed at [facility], [place] [address], [place], [place] [address]      Labs: Basic Metabolic Panel: Recent Labs  Lab 04/26/20 1636 04/27/20 0400  NA 136 138  K 3.7 3.8  CL 104 106  CO2 24 24  GLUCOSE 118* 107*  BUN 12 9  CREATININE 0.52 0.47  CALCIUM 8.9 8.6*   Liver Function Tests: Recent Labs  Lab 04/26/20 1636  AST 21  ALT 18  ALKPHOS 79  BILITOT 0.8  PROT 6.5  ALBUMIN 3.7   Recent Labs  Lab 04/26/20 1636  LIPASE 26   No results for input(s): AMMONIA in the last 168 hours. CBC: Recent Labs  Lab 04/26/20 1636 04/27/20 0400  WBC 13.6* 9.4  NEUTROABS 12.1*  --   HGB 12.7 11.9*  HCT 37.5 33.4*  MCV 90.1 86.5  PLT  233 234   Cardiac Enzymes: No results for input(s): CKTOTAL, CKMB, CKMBINDEX, TROPONINI in the last 168 hours. BNP: BNP (last 3 results) No results for input(s): BNP in the last 8760 hours.  ProBNP (last 3 results) No results for input(s): PROBNP in the last 8760 hours.  CBG: No results for input(s): GLUCAP in  the last 168 hours.     Signed:  Fayrene Helper MD.  [facility] 04/27/2020, 4:05 PM

## 2020-04-27 NOTE — ED Notes (Addendum)
Per Edwin Cap SW, Pt declining Home health. MD powell made aware. Per MD powell, pt appropriate for discharge at this time.

## 2020-04-27 NOTE — TOC Initial Note (Signed)
Transition of Care [facility]) - Initial/Assessment Note    Patient Details  Name: Elizabeth [name] MRN: [id number] Date of Birth: 07-27-1933  [facility] of [facility]) CM/SW Contact:    Erenest Rasher, RN Phone Number:  [phone] 04/27/2020, 5:25 PM  Clinical Narrative:                 TOC CM referral to arrange [facility]. Spoke to pt and she has declined HH. States she lives in an [facility] living facility at [place]. She has wheelchair that she uses if her legs get week. Her friend Caren Griffins is going to provide transport home at dc.   Expected Discharge Plan: [place] Barriers to Discharge: No Barriers Identified   Patient Goals and CMS Choice Patient states their goals for this hospitalization and ongoing recovery are:: i am going to be fine CMS Medicare.gov Compare Post Acute Care list provided to:: Patient Choice offered to / list presented to : Patient  Expected Discharge Plan and Services Expected Discharge Plan: [place] In-house Referral: Clinical Social Work Discharge Planning Services: CM Consult Post Acute Care Choice: [place] arrangements for the past 2 months: [place] Expected Discharge Date: 04/27/20                         HH Arranged: Patient Refused HH          Prior Living Arrangements/Services Living arrangements for the past 2 months: [place] Lives with:: Facility Resident Patient language and need for interpreter reviewed:: Yes Do you feel safe going back to the place where you live?: Yes      Need for Family Participation in Patient Care: No (Comment) Care giver support system in place?: No (comment) Current home services: DME (wheelchair) Criminal Activity/Legal Involvement Pertinent to Current Situation/Hospitalization: No - Comment as needed  Activities of Daily Living Home Assistive Devices/Equipment: Eyeglasses (reading glasses) ADL Screening (condition at  time of admission) Patient's cognitive ability adequate to safely complete daily activities?: Yes Is the patient deaf or have difficulty hearing?: No Does the patient have difficulty seeing, even when wearing glasses/contacts?: No Does the patient have difficulty concentrating, remembering, or making decisions?: No Patient able to express need for assistance with ADLs?: Yes Does the patient have difficulty dressing or bathing?: No Independently performs ADLs?: Yes (appropriate for developmental age) Does the patient have difficulty walking or climbing stairs?: No Weakness of Legs: Both Weakness of Arms/Hands: Both  Permission Sought/Granted Permission sought to share information with : Case Manager, Facility Sport and exercise psychologist, PCP, Family Supports Permission granted to share information with : Yes, Verbal Permission Granted  Share Information with NAME: Trudi Ida     Permission granted to share info w Relationship: friend  Permission granted to share info w Contact Information: [phone]  Emotional Assessment   Attitude/Demeanor/Rapport: Gracious Affect (typically observed): Accepting Orientation: : Oriented to Self, Oriented to Place, Oriented to  Time, Oriented to Situation   Psych Involvement: No (comment)  Admission diagnosis:  Syncope [R55] Patient Active Problem List   Diagnosis Date Noted  . Syncope 04/26/2020  . Anxiety   . IBS (irritable bowel syndrome)   . Chronic cough 12/07/2011   PCP:  Raina Mina., MD Pharmacy:   [facility], [place] [address] Phone: [phone] Fax: [place] [place] [address] Phone: [phone] Fax: [phone]     Social Determinants of Health (SDOH) Interventions    Readmission Risk Interventions No flowsheet data found.

## 2020-10-06 IMAGING — CT CT ABD-PELV W/ CM
2 of 5 series · 15 of 46 positions shown, 17 images · IV contrast (omnipaque)
Comparison: CT abdomen pelvis 03/17/2019.

CLINICAL DATA: Abdominal pain, acute, nonlocalized abd pain with
syncope

EXAM:
CT ABDOMEN AND PELVIS WITH CONTRAST
TECHNIQUE: Multidetector CT imaging of the abdomen and pelvis was performed
using the standard protocol following bolus administration of
intravenous contrast.
CONTRAST:  100mL OMNIPAQUE IOHEXOL 300 MG/ML  SOLN

[Series 2: axial st · axial · 0.71mm/px · z∈[-629,-294]mm · 12 of 79 slices shown, 14 images]
[im 6/79  soft-tissue]
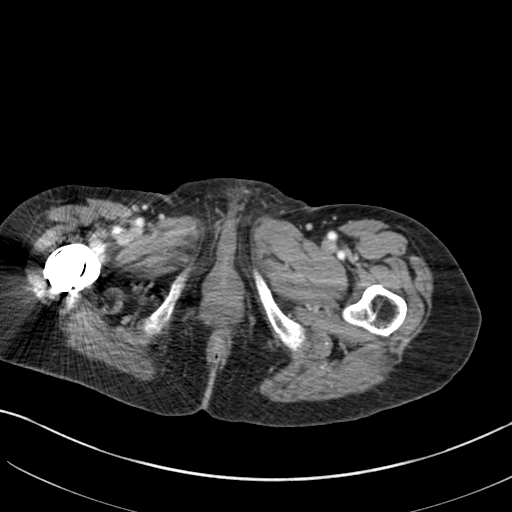
[im 6/79  bone]
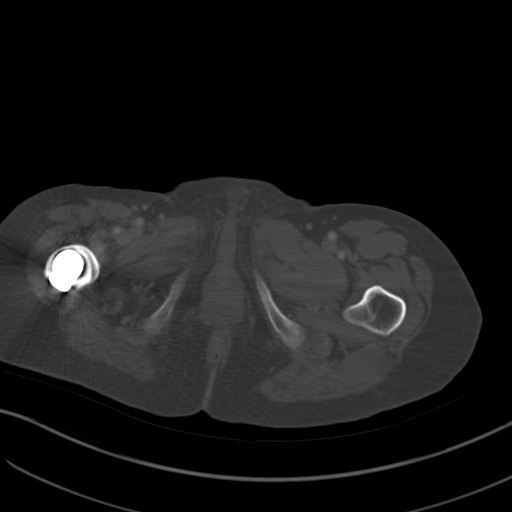
[im 12/79  soft-tissue]
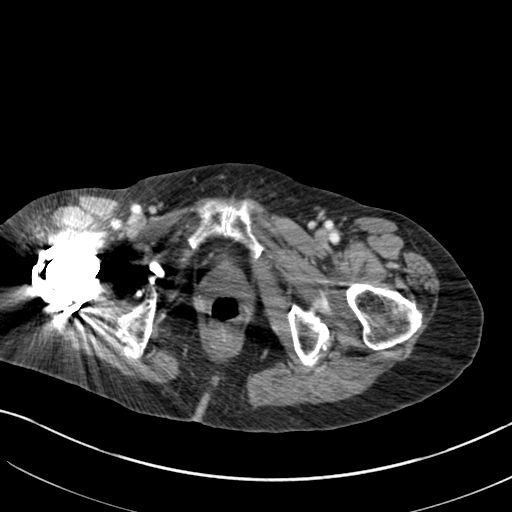
[im 17/79  soft-tissue]
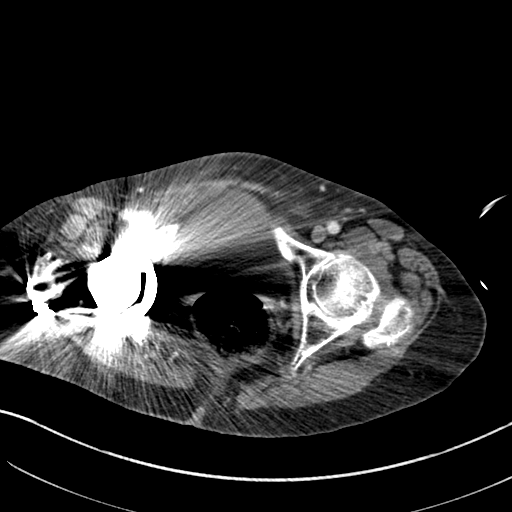
[im 23/79  soft-tissue]
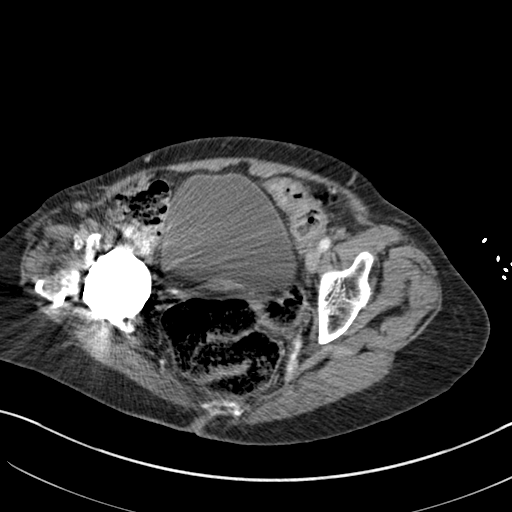
[im 28/79  soft-tissue]
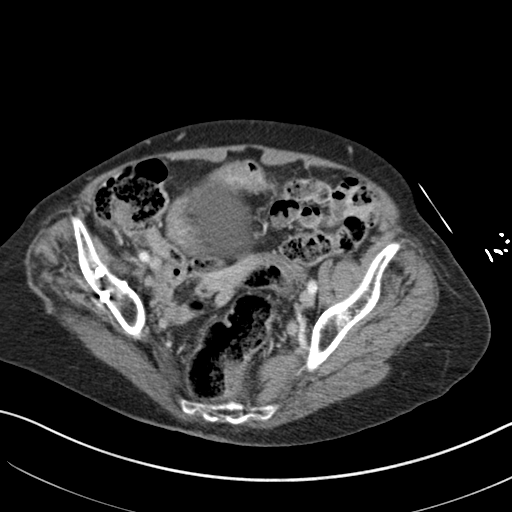
[im 34/79  soft-tissue]
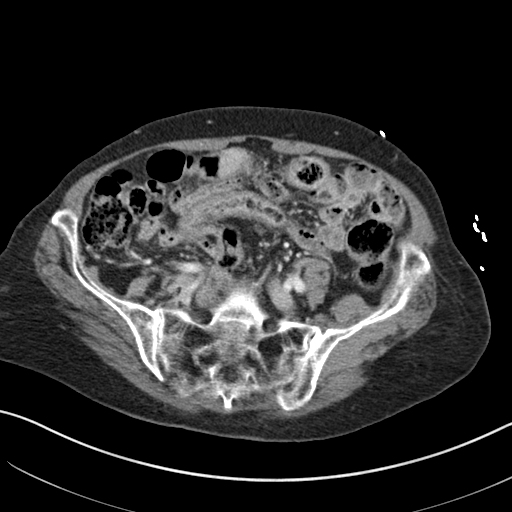
[im 45/79  soft-tissue]
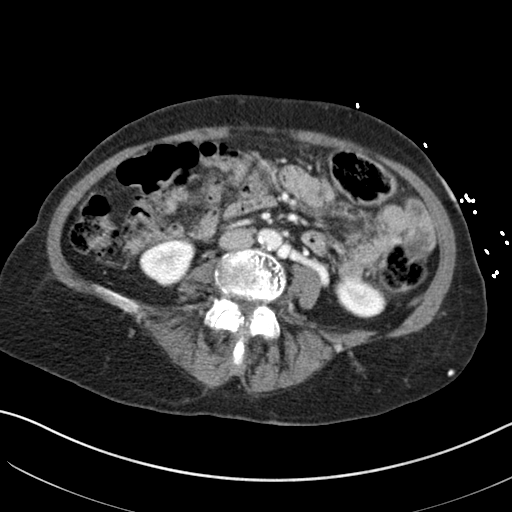
[im 51/79  soft-tissue]
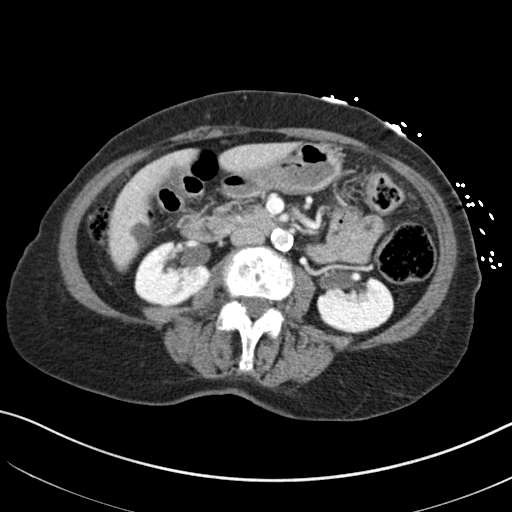
[im 56/79  soft-tissue]
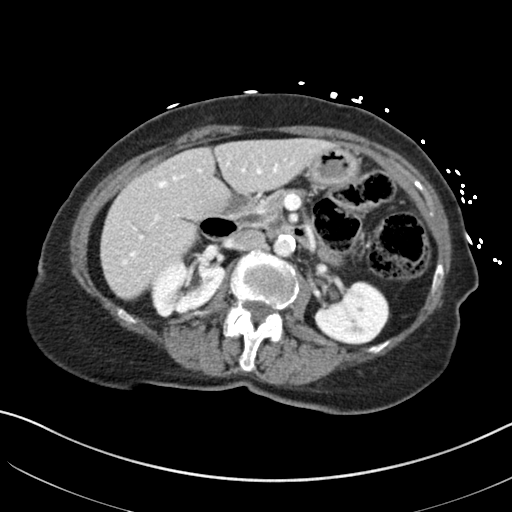
[im 56/79  bone]
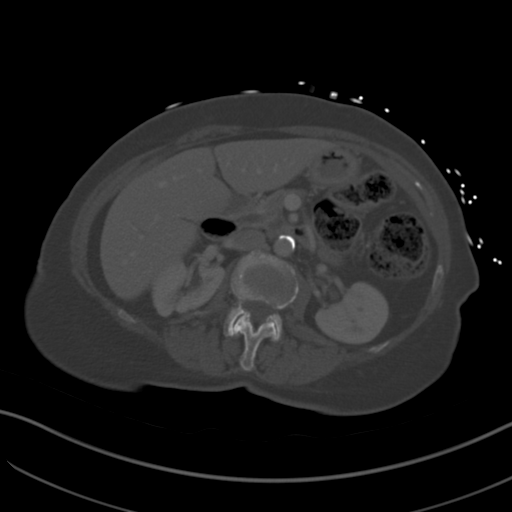
[im 62/79  soft-tissue]
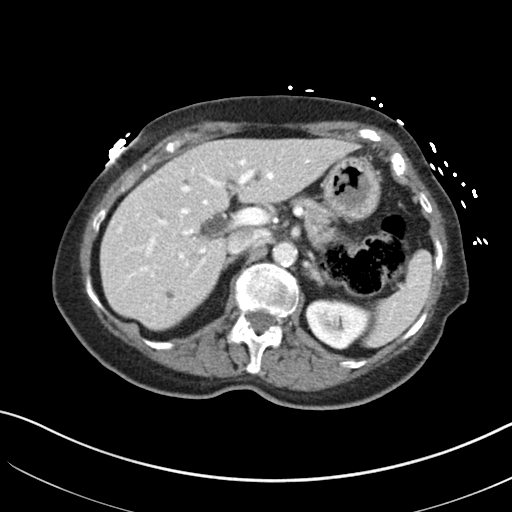
[im 67/79  soft-tissue]
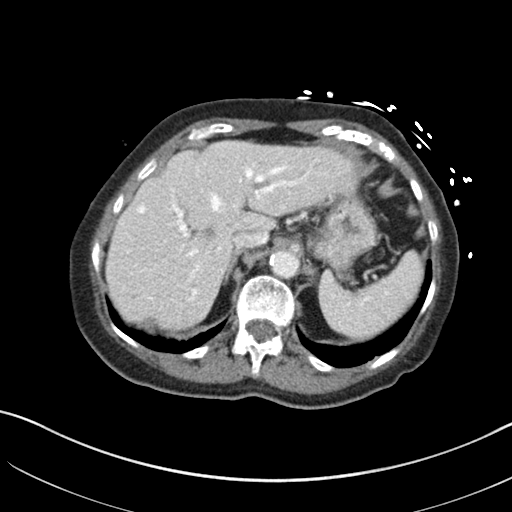
[im 73/79  soft-tissue]
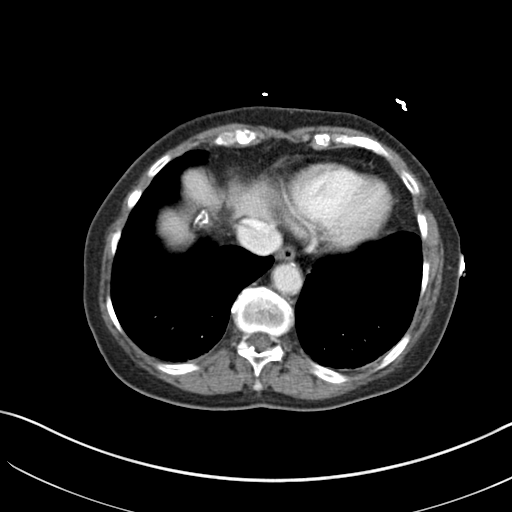

[Series 5: coronal st · coronal · 0.64mm/px · 3 of 124 slices shown]
[im 42/124  soft-tissue]
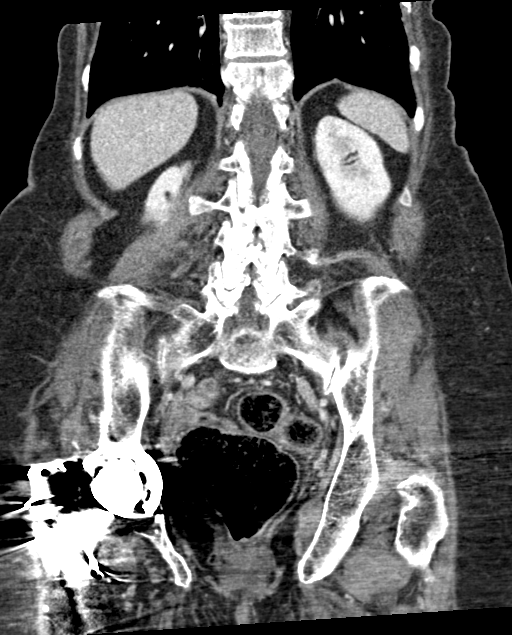
[im 55/124  soft-tissue]
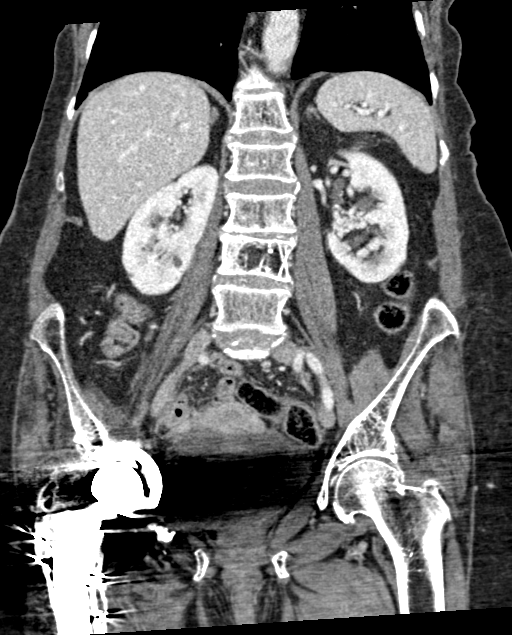
[im 69/124  soft-tissue]
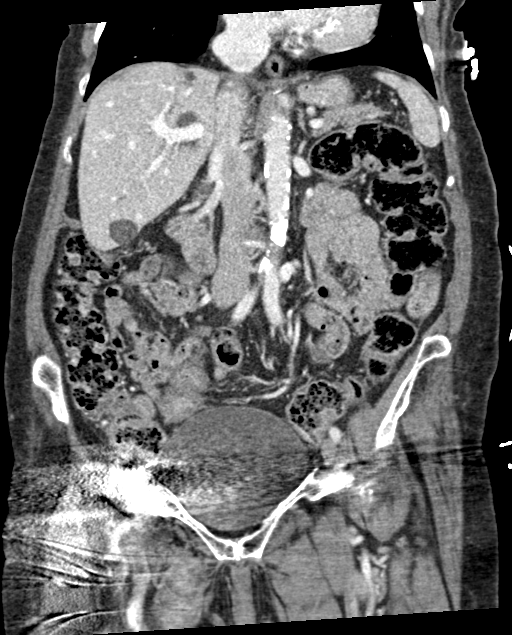

[15 of 46 positions shown; findings below may reference images not displayed]

FINDINGS: Lower chest: Coronary artery calcifications versus mitral annular
calcifications noted. Bilateral lower lobe linear atelectasis versus
scarring. No acute abnormality.

Hepatobiliary: Similar-appearing 2 cm fluid density lesion within
the right hepatic lobe likely represents a simple hepatic cyst.
Several subcentimeter hypodensities are again noted and are too
small to characterize. Similar-appearing peripherally calcified
hypodense lesion at the right hepatic dome ([DATE]). No focal liver
abnormality. Status post cholecystectomy with similar-appearing
prominent common bile duct and mild central intrahepatic biliary
ductal dilatation.

Pancreas: Diffusely atrophic. No focal lesion. Otherwise normal
pancreatic contour. No surrounding inflammatory changes. No main
pancreatic ductal dilatation.

Spleen: Normal in size without focal abnormality.

Adrenals/Urinary Tract: No adrenal nodule bilaterally. Bilateral
kidneys enhance symmetrically. Subcentimeter hypodensities are too
small to characterize. No hydronephrosis. No hydroureter. The
urinary bladder is grossly unremarkable in partially visualized due
to streak artifact originating from right femoral surgical hardware.

Stomach/Bowel: Stomach is within normal limits. The appendix is not
definitely identified. No evidence of bowel wall thickening,
distention, or inflammatory changes. Incomplete visualization of the
rectum due to streak artifact originating from the right femoral
surgical hardware.

Vascular/Lymphatic: No abdominal aorta or iliac aneurysm. Severe
atherosclerotic plaque of the aorta and its branches. The hepatic,
portal, splenic, superior mesenteric veins are patent. No abdominal,
pelvic, or inguinal lymphadenopathy.

Reproductive: Atrophic uterus. Limited evaluation of the adnexal
lesion regions due to streak artifact originating from the right
surgical femoral hardware.

Other: No intraperitoneal free fluid. No intraperitoneal free gas.
No organized fluid collection.

Musculoskeletal:

No abdominal wall hernia or abnormality

Diffusely decreased bone density. No suspicious lytic or blastic
osseous lesions. No acute displaced fracture. Hemangioma of the L4
vertebral body. Multilevel degenerative changes of the spine.
Partially visualized total right hip arthroplasty with associated
cerclage wire.
IMPRESSION: 1. No acute intra-abdominal or intrapelvic abnormality.
2.  Aortic Atherosclerosis ([id number]) -severe.

## 2022-03-09 ENCOUNTER — Encounter: Payer: Self-pay | Admitting: Gastroenterology

## 2023-11-06 DIAGNOSIS — G459 Transient cerebral ischemic attack, unspecified: Secondary | ICD-10-CM | POA: Diagnosis not present

## 2023-12-12 ENCOUNTER — Other Ambulatory Visit: Payer: Self-pay

## 2023-12-12 DIAGNOSIS — R55 Syncope and collapse: Secondary | ICD-10-CM

## 2023-12-17 ENCOUNTER — Ambulatory Visit: Attending: Internal Medicine

## 2023-12-17 DIAGNOSIS — R55 Syncope and collapse: Secondary | ICD-10-CM

## 2023-12-24 ENCOUNTER — Ambulatory Visit: Attending: Internal Medicine

## 2024-03-19 ENCOUNTER — Ambulatory Visit: Admitting: Neurology
